# Patient Record
Sex: Female | Born: 1945 | Race: Black or African American | Hispanic: No | Smoking: Former smoker
Health system: Southern US, Community
[De-identification: ages and names within clinical notes are randomized; demographics above are authoritative.]

## PROBLEM LIST (undated history)

## (undated) DIAGNOSIS — E785 Hyperlipidemia, unspecified: Secondary | ICD-10-CM

## (undated) DIAGNOSIS — I2510 Atherosclerotic heart disease of native coronary artery without angina pectoris: Secondary | ICD-10-CM

## (undated) DIAGNOSIS — K219 Gastro-esophageal reflux disease without esophagitis: Secondary | ICD-10-CM

## (undated) DIAGNOSIS — N8110 Cystocele, unspecified: Secondary | ICD-10-CM

## (undated) DIAGNOSIS — I10 Essential (primary) hypertension: Secondary | ICD-10-CM

## (undated) DIAGNOSIS — H409 Unspecified glaucoma: Secondary | ICD-10-CM

## (undated) DIAGNOSIS — Z951 Presence of aortocoronary bypass graft: Secondary | ICD-10-CM

## (undated) DIAGNOSIS — R0602 Shortness of breath: Secondary | ICD-10-CM

## (undated) HISTORY — DX: Hyperlipidemia, unspecified: E78.5

## (undated) HISTORY — DX: Cystocele, unspecified: N81.10

## (undated) HISTORY — DX: Unspecified glaucoma: H40.9

## (undated) HISTORY — DX: Atherosclerotic heart disease of native coronary artery without angina pectoris: I25.10

## (undated) HISTORY — DX: Presence of aortocoronary bypass graft: Z95.1

## (undated) HISTORY — PX: ANTERIOR AND POSTERIOR REPAIR: SHX1172

---

## 1996-03-10 HISTORY — PX: ABDOMINAL HYSTERECTOMY: SHX81

## 1999-03-11 DIAGNOSIS — Z951 Presence of aortocoronary bypass graft: Secondary | ICD-10-CM

## 1999-03-11 HISTORY — DX: Presence of aortocoronary bypass graft: Z95.1

## 2000-01-13 ENCOUNTER — Encounter: Payer: Self-pay | Admitting: Cardiovascular Disease

## 2000-01-13 ENCOUNTER — Inpatient Hospital Stay (HOSPITAL_COMMUNITY): Admission: AD | Admit: 2000-01-13 | Discharge: 2000-01-19 | Payer: Self-pay | Admitting: Cardiovascular Disease

## 2000-01-13 HISTORY — PX: CARDIAC CATHETERIZATION: SHX172

## 2000-01-14 ENCOUNTER — Encounter: Payer: Self-pay | Admitting: Thoracic Surgery (Cardiothoracic Vascular Surgery)

## 2000-01-14 HISTORY — PX: CORONARY ARTERY BYPASS GRAFT: SHX141

## 2000-01-15 ENCOUNTER — Encounter: Payer: Self-pay | Admitting: Thoracic Surgery (Cardiothoracic Vascular Surgery)

## 2000-01-16 ENCOUNTER — Encounter: Payer: Self-pay | Admitting: Thoracic Surgery (Cardiothoracic Vascular Surgery)

## 2000-12-01 ENCOUNTER — Other Ambulatory Visit: Admission: RE | Admit: 2000-12-01 | Discharge: 2000-12-01 | Payer: Self-pay | Admitting: Obstetrics and Gynecology

## 2001-07-21 ENCOUNTER — Encounter: Payer: Self-pay | Admitting: Obstetrics and Gynecology

## 2001-07-21 ENCOUNTER — Ambulatory Visit (HOSPITAL_COMMUNITY): Admission: RE | Admit: 2001-07-21 | Discharge: 2001-07-21 | Payer: Self-pay | Admitting: Obstetrics and Gynecology

## 2002-07-25 ENCOUNTER — Ambulatory Visit (HOSPITAL_COMMUNITY): Admission: RE | Admit: 2002-07-25 | Discharge: 2002-07-25 | Payer: Self-pay | Admitting: Obstetrics and Gynecology

## 2002-07-25 ENCOUNTER — Encounter: Payer: Self-pay | Admitting: Obstetrics and Gynecology

## 2003-07-28 ENCOUNTER — Ambulatory Visit (HOSPITAL_COMMUNITY): Admission: RE | Admit: 2003-07-28 | Discharge: 2003-07-28 | Payer: Self-pay | Admitting: Obstetrics and Gynecology

## 2004-07-30 ENCOUNTER — Ambulatory Visit (HOSPITAL_COMMUNITY): Admission: RE | Admit: 2004-07-30 | Discharge: 2004-07-30 | Payer: Self-pay | Admitting: Obstetrics & Gynecology

## 2004-10-14 ENCOUNTER — Ambulatory Visit: Payer: Self-pay | Admitting: Orthopedic Surgery

## 2004-11-25 ENCOUNTER — Ambulatory Visit: Payer: Self-pay | Admitting: Orthopedic Surgery

## 2005-07-18 ENCOUNTER — Ambulatory Visit (HOSPITAL_COMMUNITY): Admission: RE | Admit: 2005-07-18 | Discharge: 2005-07-18 | Payer: Self-pay | Admitting: Internal Medicine

## 2005-07-22 ENCOUNTER — Ambulatory Visit (HOSPITAL_COMMUNITY): Admission: RE | Admit: 2005-07-22 | Discharge: 2005-07-22 | Payer: Self-pay | Admitting: Internal Medicine

## 2005-07-22 ENCOUNTER — Ambulatory Visit: Payer: Self-pay | Admitting: Internal Medicine

## 2005-07-22 HISTORY — PX: COLONOSCOPY: SHX174

## 2005-08-01 ENCOUNTER — Ambulatory Visit (HOSPITAL_COMMUNITY): Admission: RE | Admit: 2005-08-01 | Discharge: 2005-08-01 | Payer: Self-pay | Admitting: Obstetrics and Gynecology

## 2006-08-04 ENCOUNTER — Ambulatory Visit (HOSPITAL_COMMUNITY): Admission: RE | Admit: 2006-08-04 | Discharge: 2006-08-04 | Payer: Self-pay | Admitting: Obstetrics & Gynecology

## 2007-01-18 ENCOUNTER — Other Ambulatory Visit: Admission: RE | Admit: 2007-01-18 | Discharge: 2007-01-18 | Payer: Self-pay | Admitting: Obstetrics and Gynecology

## 2007-03-11 HISTORY — PX: FLEXIBLE SIGMOIDOSCOPY: SHX1649

## 2007-08-06 ENCOUNTER — Ambulatory Visit (HOSPITAL_COMMUNITY): Admission: RE | Admit: 2007-08-06 | Discharge: 2007-08-06 | Payer: Self-pay | Admitting: Obstetrics & Gynecology

## 2007-12-06 ENCOUNTER — Inpatient Hospital Stay (HOSPITAL_COMMUNITY): Admission: EM | Admit: 2007-12-06 | Discharge: 2007-12-08 | Payer: Self-pay | Admitting: Emergency Medicine

## 2007-12-07 ENCOUNTER — Encounter (INDEPENDENT_AMBULATORY_CARE_PROVIDER_SITE_OTHER): Payer: Self-pay | Admitting: Gastroenterology

## 2008-08-09 ENCOUNTER — Ambulatory Visit (HOSPITAL_COMMUNITY): Admission: RE | Admit: 2008-08-09 | Discharge: 2008-08-09 | Payer: Self-pay | Admitting: Obstetrics & Gynecology

## 2009-08-17 ENCOUNTER — Ambulatory Visit (HOSPITAL_COMMUNITY): Admission: RE | Admit: 2009-08-17 | Discharge: 2009-08-17 | Payer: Self-pay | Admitting: Obstetrics & Gynecology

## 2009-11-26 ENCOUNTER — Ambulatory Visit (HOSPITAL_COMMUNITY): Admission: RE | Admit: 2009-11-26 | Discharge: 2009-11-26 | Payer: Self-pay | Admitting: Internal Medicine

## 2010-03-31 ENCOUNTER — Encounter: Payer: Self-pay | Admitting: Obstetrics & Gynecology

## 2010-07-16 ENCOUNTER — Encounter: Payer: Self-pay | Admitting: Internal Medicine

## 2010-07-23 NOTE — Op Note (Signed)
NAMEDONNICE, NIELSEN                   ACCOUNT NO.:  [id number]   MEDICAL RECORD NO.:  [id number]          PATIENT TYPE:  INP   LOCATION:  5522                         FACILITY:  [facility]   PHYSICIAN:  Petra Kuba, M.D.    DATE OF BIRTH:  July 01, 1945   DATE OF PROCEDURE:  12/07/2007  DATE OF DISCHARGE:                               OPERATIVE REPORT   PROCEDURE:  Flex sigmoidoscopy and biopsy.   INDICATION:  Questionable ischemic colitis.  Consent was signed after  the risks, benefits, and management options thoroughly discussed  multiple times in the past and before any sedation.   MEDICINES USED:  Fentanyl 50 mcg and Versed 5 mg.   PROCEDURE:  Rectal inspection pertinent for external hemorrhoid, small.  Digital rectal exam was negative.  Pediatric colonoscope was inserted,  easily advanced to about 60 cm, beginning at about 45 cm with some  obvious ulceration and erythema, which seemed to increase proximally.  At that point, at 60 cm, there was marked narrowing and the patient  began experiencing pain and we elected to withdraw.  Few biopsies of the  ulcerated area were obtained.  I think this had more ulceration and  pseudomembranes.  Although, it could have been pseudomembranes, but the  biopsies were obtained.  Again on beginning at about the sigmoid, it was  normal, which included a good look at the rectum on straight and  retroflexed visualization, which other than some small hemorrhoids, no  other abnormalities were seen.  Anorectal pull-through confirmed the  hemorrhoids.  The scope was drained and readvanced towards the left side  of the colon.  Air was suctioned and the scope removed.  The patient  tolerated the procedure well.  There was no obvious immediate  complications.   ENDOSCOPIC DIAGNOSES:  1. Small internal and external hemorrhoids.  2. Descending ulcerations and inflammation, which increase      approximately, status post biopsy, only advanced to 60 cm, stopped     secondary to increasing inflammation and lumen narrowing and pain.  3. Normal sigmoid.   PLAN:  Await pathology.  We will have soft solids until pain is better.  Continue with antibiotics probably for 1 week and probably home to  resume a low residue diet, if no further problems and tolerating p.o.           ______________________________  Petra Kuba, M.D.     MEM/MEDQ  D:  12/07/2007  T:  12/08/2007  Job:  [id number]   cc:   R. Roetta Sessions, M.D.

## 2010-07-23 NOTE — H&P (Signed)
NAMEDEAN, Kristin Taylor                   ACCOUNT NO.:  [id number]   MEDICAL RECORD NO.:  [id number]          PATIENT TYPE:  INP   LOCATION:  5504                         FACILITY:  [facility]   PHYSICIAN:  Kristin Ferrara, MD         DATE OF BIRTH:  1946-02-23   DATE OF ADMISSION:  12/05/2007  DATE OF DISCHARGE:                              HISTORY & PHYSICAL   PRIMARY CARE PHYSICIAN:  Unassigned to [place].   HISTORY OF PRESENT ILLNESS:  The patient is a 65 year old African  American female with a chief complaint of rectal bleeding which started  earlier in the daytime accompanied by sharp, diffuse abdominal cramping,  nonradiating, accompanied by chills but no documented fevers and  diarrhea.  She gets regular colonoscopy by Dr. Christell Constant at [facility] for the purpose of the family history of colon cancer.  She denies any numbness, nausea, vomiting, hematemesis.  There is no  documented fever.  No chest pain, shortness of breath, orthopnea.   PAST MEDICAL HISTORY:  1. Coronary artery disease.  2. Hypertension.  3. Gastroesophageal reflux disease.  4. Hypercholesterolemia.   PAST SURGICAL HISTORY:  1. Status post CABG.  2. Status post hysterectomy.   ALLERGIES:  NO KNOWN DRUG ALLERGIES.   SOCIAL HISTORY:  Denies drugs, alcohol or tobacco.   MEDICATIONS AT HOME:  Lipitor and a multivitamin, Nexium, Toprol XL,  Zetia,   REVIEW OF SYSTEMS:  As per HPI, otherwise negative.   PHYSICAL EXAMINATION:  GENERAL:  The patient is in no acute distress.  VITAL SIGNS:  Blood pressure is 121/64, pulse 79, respirations 20,  temperature 97.1, pulse ox is 98% on room air.  HEENT: Normocephalic, atraumatic.  Sclerae is anicteric.  NECK:  Supple.  No JVD or carotid bruits.  PERRLA.  Extraocular muscles  intact.  CARDIOVASCULAR:  S1 and S2.  Regular rate and rhythm.  No murmurs, rubs  or clicks.  LUNGS:  Clear to auscultation bilaterally.  No rhonchi, rales or wheeze.  ABDOMEN:  Soft, nontender,  nondistended.  Positive bowel sounds tremors.  EXTREMITIES:  No clubbing, cyanosis or edema.  NEURO:  Patient is alert, oriented x3.  Cranial nerves II-XII grossly  intact.Marland Kitchen  RECTAL:  There is maroon-colored stool.  There are hemorrhoids noted  that are nonbleeding.   The patient was given pain medication and resuscitated with 1 L normal  saline in the emergency room.  She was  typed and crossed.   LABORATORY DATA:  Lactic acid 0.9.  Urinalysis shows moderate amount  leukocytes and bacteria.  Lipase is 20.  Complete metabolic panel is  within normal limits.  ACL, ALT and alk phos 20, 16, 80 respectively.  INR 1.  White count 13.1, hemoglobin 13.2, hematocrit 39.5, MCV is 89.7,  platelets 273,000 and the patient had a CT scan of the abdomen and  pelvis with contrast.  Results are pending.   ASSESSMENT/PLAN:  A 65 year old with:  1. Bright red blood per rectum with normal hemoglobin, hemorrhoids are      not bleeding.  2. History  of coronary artery disease, status post coronary artery      bypass grafting, with no chest pain, shortness of breath or EKG      changes.  3. Abdominal pain, cramping and diarrhea.  Question symptoms      consistent with diverticulitis.  Less likely ischemic colitis,      given low lactic acid.   DISCUSSION AND PLAN:  Will go ahead and admit the patient to the medical  telemetry unit.  Will type and cross and hold two units of packed red  blood cells.  Will monitor H&H q.6 h and transfuse if hemoglobin drops.  I have already spoken to [facility] for evaluation and  possible colonoscopy once the patient has cooled off although she has  mild leukocytosis.  Will start the patient on ciprofloxacin and Flagyl.  As far as the rest of her medications, will continue her medications  including her Lipitor, Nexium and Toprol-XL.  Watch her vital signs.  The rest of the plans depend on her progress and consultant  recommendations.  Will hold her aspirin  for now.      Kristin Ferrara, MD  Electronically Signed     RR/MEDQ  D:  12/06/2007  T:  12/06/2007  Job:  [id number]

## 2010-07-23 NOTE — Discharge Summary (Signed)
NAMEALEXIYA, FRANQUI                   ACCOUNT NO.:  [id number]   MEDICAL RECORD NO.:  [id number]          PATIENT TYPE:  INP   LOCATION:  5522                         FACILITY:  [facility]   PHYSICIAN:  Lonia Blood, M.D.       DATE OF BIRTH:  January 24, 1946   DATE OF ADMISSION:  12/05/2007  DATE OF DISCHARGE:  12/08/2007                               DISCHARGE SUMMARY   PRIMARY CARE PHYSICIAN:  Madelin Rear. Fusco, MD   DISCHARGE DIAGNOSES:  1. Mild ischemic colitis.  2. Atherosclerosis.  3. Coronary artery disease status post coronary artery bypass graft.  4. Mild anemia.  5. Hyperlipidemia.   DISCHARGE MEDICATIONS:  1. Flagyl 500 mg by mouth 3 times a day for 1 week.  2. Metoprolol 25 mg daily.  3. Nexium 40 mg daily.  4. Aspirin 81 mg daily.  5. Lipitor 10 mg daily.  6. Zetia 10 mg daily.  7. Calcium 600 mg daily.  8. Vitamin D 1 tablet daily.  9. Fish oil 1000 mg daily.  10.Coenzyme Q 1 daily.   CONDITION AT DISCHARGE:  Kristin Taylor is discharged in good condition.  She  has no further abdominal pain or diarrhea.   She is told to follow up with Dr. Sherwood Gambler next week.  The main thing to  discuss at the follow up visit is the patient should boost her  atherosclerosis treatment by adding Plavix.   HISTORY AND PHYSICAL:  Please refer to dictated H and P done by Dr.  Flonnie Overman on December 06, 2007.   CONSULTATION:  During this admission, this patient was seen in  consultation by Dr. Ewing Schlein from [facility].   PROCEDURE:  During this admission, the patient underwent a flex  sigmoidoscopy by Dr. Ewing Schlein, findings of colitis, possible ischemic.  Pathology pending.   HOSPITAL COURSE:  [name], a 65 year old woman, was admitted with  abdominal pain and bloody bowel movements.  She had a CT scan of her  abdomen and pelvis which indicated segmental colitis.  The patient had 3  sets of C. diff toxins which were all negative.  She remained afebrile  and without signs of sepsis.  The  patient had a flexible endoscopy which  showed mucosal changes compatible with ischemic colitis.  Given the  patient's history of atherosclerosis, we felt that this was the etiology  of her ischemic colitis.  [name] was advanced on her diet, and  she did improve nicely with resolution of her leukocytosis, abdominal  pain, and diarrhea.  The patient is discharged home on her regular home  medications and to have a follow up discussion with Dr. Sherwood Gambler and her  primary gastroenterologist about using Plavix or not.      Lonia Blood, M.D.  Electronically Signed     SL/MEDQ  D:  12/08/2007  T:  12/09/2007  Job:  [id number]   cc:   Madelin Rear. Sherwood Gambler, MD

## 2010-07-23 NOTE — Consult Note (Signed)
Kristin Taylor, Kristin Taylor                   ACCOUNT NO.:  [id number]   MEDICAL RECORD NO.:  [id number]          PATIENT TYPE:  INP   LOCATION:  5522                         FACILITY:  [facility]   PHYSICIAN:  Petra Kuba, M.D.    DATE OF BIRTH:  [birth year]   DATE OF CONSULTATION:  12/06/2007  DATE OF DISCHARGE:                                 CONSULTATION   We were asked to see Ms. Kristin Taylor today in consultation for rectal bleed by  Dr. Flonnie Overman of the [facility] Service today, December 06, 2007.   HISTORY OF PRESENT ILLNESS:  This is a very pleasant 65 year old female  who had 3 episodes of diarrhea yesterday and then some bright red blood  per rectum with a lot of abdominal cramping.  She came to the ER.  She  had 3 more episodes of diarrhea overnight.  Her abdominal pain has  resolved.  She has had no vomiting, heartburn, weight loss, or other  recent illness.  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 diff or other pathogens.  No recent antibiotic use.   PAST MEDICAL HISTORY:  Significant for her colonoscopy that was done in  May 2007, by Dr. Jena Gauss.  She had 2 cecal diverticula.  She is at high  risk for colon cancer as her mother passed with it.  She has a history  of coronary artery disease with status post coronary artery bypass  grafting x2 in November 2001.  She has a history of hypertension, GERD,  and hypercholesterolemia.  She is status post hysterectomy.  Her  medications include an 81 mg aspirin, Nexium, metoprolol, and the rest  are per chart.  She has no known drug allergies.   REVIEW OF SYSTEMS:  As per HPI, but she has no shortness of breath, no  palpitations, no anorexia.   SOCIAL HISTORY:  She has a 50-year tobacco history.  She quit smoking in  2001.  She has no alcohol history.  No drug history.   FAMILY HISTORY:  Significant for mother with colon cancer.   PHYSICAL EXAMINATION:  GENERAL:  She is alert and oriented, in no  apparent  distress.  She is very pleasant to speak with.  VITAL SIGNS:  Her temperature is 98.4, pulse 69, respirations 16, and  blood pressure is 116/65.  HEART:  Regular rate and rhythm.  LUNGS:  Clear.  ABDOMEN:  Soft, nontender, and nondistended with good bowel sounds.   LABORATORY DATA:  Hemoglobin that was 13.2 with an MCV of 89.7 upon  admission, it has dropped to 11.7 overnight with hydration.  Her  hematocrit is currently 34.4, white count 13.1.  BMET is within normal  limits except for glucose of 149.  Her lipase is 20.  LFTs are within  normal limits.  Her PT is 13.3 and INR is 1.0.  She is guaiac-positive.  Her urinalysis showed calcium oxalate crystals and a possible UTI.  On  CT scan done yesterday showed:  1. Coronary artery disease with mild abdominal aortic atherosclerosis.  2. Transverse and descending colonic thickening up to 1 cm.   ASSESSMENT:  Dr. Vida Rigger has seen and examined the patient, collected  her history, and reviewed her chart.  His impression is this is a 36-  year-old female with multiple medical problems, likely ischemic colitis.  However, this could be infectious given her exposures.   PLAN:  Supportive care with possible colonoscopy as an inpatient or an  outpatient to follow up on the bleeding after she improves.  Thanks very  much for this consultation.      Stephani Police, PA    ______________________________  Petra Kuba, M.D.    MLY/MEDQ  D:  12/06/2007  T:  12/06/2007  Job:  [id number]   cc:   R. Roetta Sessions, M.D.  Petra Kuba, M.D.  Madelin Rear. Sherwood Gambler, MD

## 2010-07-26 NOTE — Op Note (Signed)
NAME:  Kristin Taylor, Kristin Taylor                   ACCOUNT NO.:  [id number]   MEDICAL RECORD NO.:  [id number]          PATIENT TYPE:  AMB   LOCATION:  DAY                           FACILITY:  [facility]   PHYSICIAN:  R. Roetta Sessions, M.D. DATE OF BIRTH:  [birth year]   DATE OF PROCEDURE:  07/22/2005  DATE OF DISCHARGE:  07/18/2005                                 OPERATIVE REPORT   INDICATIONS FOR PROCEDURE:  Patient is a 65 year old lady with a positive  family history of colorectal cancer in her mother who comes for high risk  screening.  Her last colonoscopy was in 2000.  She had a cecal diverticulum  at that time.  She has no lower GI tract symptoms.  Colonoscopy is now being  done as a high risk screening maneuver.  This approach has been discussed  with the patient at length.  Potential risks, benefits and alternatives have  been reviewed, questions answered here, please documentation in medical  record.   PROCEDURE NOTE:  The O2 saturation, blood pressure, pulse and respirations  were monitored throughout the entire procedure.   CONSCIOUS SEDATION:  1.  Versed 4 mg IV.  2.  Demerol 75 mg IV in divided doses.   INSTRUMENT:  Olympus video chip system.   FINDINGS:  Digital rectal exam revealed no abnormalities.  No scalp  findings.  Prep was adequate.   RECTUM:  Examination of rectal mucosa including retroflexed view of the anal  verge revealed no abnormalities.   COLON:  Colonic mucosa was surveyed from the rectosigmoid junction into the  left transverse, right colon, appendiceal orifice, ileocecal valve and  cecum.  The structures were well seen and photographed for the record.  From  this level the scope was slowly and cautiously withdrawn and all previously  mentioned mucosal surfaces were again seen.  The only abnormality observed  were a couple of cecal diverticula.  Remainder of colonic mucosa appeared  normal.  The patient tolerated the procedure well, was reactive to  endoscopy.   IMPRESSION:  Two cecal diverticula.  Remainder of the colonic mucosa  appeared normal, normal rectum.   RECOMMENDATIONS:  Repeat high risk screening colonoscopy in 5 years.      Jonathon Bellows, M.D.  Electronically Signed     RMR/MEDQ  D:  07/22/2005  T:  07/22/2005  Job:  [id number]   cc:   Madelin Rear. Sherwood Gambler, MD  Fax: [phone]

## 2010-07-26 NOTE — Discharge Summary (Signed)
[place]  Patient:    Kristin Taylor, Kristin Taylor                            MRN: [id number] Adm. Date:  [id number] Disc. Date: [id number] Attending:  Charlett Lango Dictator:   Loura Pardon, P.A. CC:         Lennette Bihari, M.D.  Elfredia Nevins, M.D., [place], [place]   Discharge Summary  DATE OF BIRTH:  06/28/1945  CARDIOLOGIST:  Lennette Bihari, M.D.  PRIMARY CARE GIVER:  Elfredia Nevins, M.D.  FINAL DIAGNOSES: 1. Unstable angina. 2. Severe single-vessel atherosclerotic coronary artery disease involving the    left main coronary artery. 3. Postoperative acute blood loss anemia (no transfusion).  The nadir    hemoglobin was 7.6 on postoperative day #3.  The hemoglobin on    postoperative day #4 was 7.8.  The patient was asymptomatic.  PROCEDURES: 1. On January 13, 2000, left heart catheterization, left ventriculogram, and    coronary angiography.  The study showed a 90% ostial left main stenosis    with nonobstructive atherosclerotic coronary artery disease effecting the    large vessels of the heart.  Ejection fraction estimated at 61%.  The    cardiologist was Lennette Bihari, M.D. 2. Carotid duplex ultrasound on January 13, 2000.  This study demonstrated no    hemodynamically significant internal carotid artery stenosis. 3. On January 14, 2000, coronary artery bypass graft surgery x 2.  The grafts    were the left internal mammary artery connected in an end-to-side fashion    to the left anterior descending coronary artery and the right internal    mammary artery connected to the ramus intermedius through the transverse    sinus.  The surgeon was Salvatore Decent. Dorris Fetch, M.D.  The patient tolerated    the procedure well and was transferred in stable and satisfactory condition    to the intensive care unit.  DISCHARGE DISPOSITION:  The patient was judged a suitable candidate for discharge on postoperative day #5.  She was extubated on the evening  of surgery, achieving 100% oxygen saturation on 4 L of nasal cannula.  She was relieved of all supplemental oxygen by postoperative day #1, achieving 98% oxygen saturation.  She was 16 pounds fluid positive after her surgery and she is still 10 pounds fluid positive on postoperative day #4.  She was go home with Lasix gentle diuresis for a five-day period.  She experienced acute blood loss anemia with a hemoglobin nadir of 7.6 on postoperative day #3, rising to 7.8, and considered stable on postoperative day #4.  She will go home on iron supplemental therapy.  She received no transfusions postoperatively.  Her mental status has been clear.  She is afebrile in the postoperative period.  She has experienced no cardiac dysrhythmias and no respiratory distress.  She is ambulating independently.  She is taking oral nourishment well.  Her pain is controlled well with oral analgesia.  Her median sternotomy incision is healing well.  DISCHARGE MEDICATIONS: 1. Enteric-coated aspirin 325 mg daily. 2. Lopressor 50 mg one half tablet in the morning and one half tablet in the    evening. 3. Lasix 40 mg daily for five days. 4. Potassium chloride 20 mEq daily for five days. 5. Niferex 150 mg p.o. b.i.d. with food. 6. Percocet 5/325 mg one to two tablets every four to six hours p.r.n. pain. 7. Resuming estropipate tablet  taken daily.  DISCHARGE ACTIVITY:  Ambulation as tolerated.  She is asked not to drive nor lift more than 10 pounds for six weeks.  DISCHARGE DIET:  Low sodium, low cholesterol.  WOUND CARE:  She may shower daily, keeping the incision clean and dry.  FOLLOW-UP:  She will have a follow-up visit with her cardiologist, Lennette Bihari, M.D., in two weeks.  She is asked to call [phone] to arrange that. She will have an appointment with Salvatore Decent. Dorris Fetch, M.D., in three weeks after discharge.  Dr. Rondel Jumbo office will call to arrange this.  BRIEF HISTORY:  [name] is a  65 year old female without known prior history of atherosclerotic coronary artery disease.  For the past several months, she has noticed indigestion and heartburn with exertion.  She experiences this while walking fast, especially up hill.  Sometimes the indigestion symptoms radiate down her back and down her left arm.  She did undergo an exercise Cardiolite study earlier the past summer.  She had reduced aerobic capacity and exercises for 4 minutes and 46 seconds under standard Bruce protocol.  The peak heart rate was 160 at 100% of maximum.  She developed transient trigeminy in stage 2 of the protocol and she developed upsloping ST segment depression at peak stress with fairly prompt normalization two minutes after recovery. Scintigraphic changes were notable in that there was very small zone and subtle apical/distal inferolateral ischemia.  The patient was followed up by Elfredia Nevins, M.D.  She presented to his office on January 10, 2000, with complaints of exertion and precipitated indigestion and is here now for cardiology consultation.  HOSPITAL COURSE:  She was admitted to [facility] to the service of Lennette Bihari, M.D., on January 13, 2000.  She underwent left heart catheterization the same day.  The study demonstrated a 90% ostial left main stenosis with an ejection fraction estimated at 61%.  She had no particularly obstructive coronary disease otherwise.  She was scheduled for coronary artery revascularization.  Salvatore Decent Dorris Fetch, M.D., of the [facility] was consulted.  He described the risks and benefits of cardiac surgery to Ms. Salem and she elected to undergo the procedure.  She also had a carotid duplex ultrasound which showed no hemodynamically significant internal carotid artery stenosis.  The surgery was performed on January 14, 2000.  Two bypasses were placed as described above.  She was extubated on the  day of surgery.  Her cardiac index in the  immediate postoperative period was 2.22.  Cardiac output was 4.35.  The postoperative hemoglobin was 9, hematocrit 25.4%, and platelets 203,000. After extubation, she was achieving 100% oxygen saturation on 4 L of nasal cannula.  On postoperative day #1, the hemoglobin was 9, hematocrit 27, she was achieving 98% oxygen saturation on room air, and she remained on room air throughout the entire postoperative period.  She was in a sinus rhythm and 16 pound fluid overload.  Gentle diuresis was begun on postoperative day #1. On postoperative day #2, the incisions were healing nicely.  The pleural tubes were discontinued.  The hemoglobin had fallen to 8.2.  The creatinine was 0.7. On postoperative day #3, iron supplementation was added for a hemoglobin of 7.6 and her hematocrit was watched carefully.  She would get transfusion if the hematocrit dropped to below 21%.  On postoperative day #4, she was ambulating independently.  Her mental status was clear.  The hematocrit had risen to 22.5%.  She  was still 10 pounds fluid positive and diuresis would continue as an outpatient.  She was judged a suitable candidate for discharge on postoperative day #5, January 19, 2000.  She goes home with medications and discharge follow-up instructions as dictated above. DD:  01/18/00 TD:  01/19/00 Job: [id number]

## 2010-07-26 NOTE — Op Note (Signed)
Kristin Taylor. [facility]  Patient:    Kristin Taylor, Kristin Taylor                            MRN: [id number] Proc. Date: 01/14/00 Adm. Date:  [id number] Attending:  Charlett Lango CC:         Lennette Bihari, M.D.  Dr. Artis Delay   Operative Report  PREOPERATIVE DIAGNOSIS:  Left main disease, unstable angina.  POSTOPERATIVE DIAGNOSIS:  Left main disease, unstable angina.  OPERATION PERFORMED:  Median sternotomy, extracorporeal circulation, coronary artery bypass grafting x 2, left internal mammary artery to left anterior descending, right internal mammary artery to ramus intermedius (via transverse sinus).  SURGEON:  Salvatore Decent. Dorris Fetch, M.D.  ASSISTANT:  Sherrie George, P.A.  ANESTHESIA:  General.  FINDINGS:  Good quality targets, good quality conduits, preserved left ventricular function.  INDICATIONS FOR PROCEDURE:  The patient is a 65 year old female with approximately a one-year history of exertional "indigestion" and now has had progression of her symptoms to minimal activity and even has had symptoms at rest.  She underwent cardiac catheterization by Dr. Nicki Guadalajara which revealed a 90% ostial left main stenosis.  Patient was referred for coronary artery bypass grafting.  The indications, risks, benefits and alternatives were discussed in detail with the patient and she understood and accepted the risks and agreed to proceed.  She was scheduled for the first available operating time which was the following morning.  DESCRIPTION OF PROCEDURE:  [name] was brought to the preop holding area on January 14, 2000.  Lines were placed to monitor arterial, central venous and pulmonary arterial pressure.  EKG leads were placed for continuous telemetry. The patient was taken to the operating room, anesthetized and intubated.  A Foley catheter was placed.  Intravenous antibiotics were administered.  The chest, abdomen and legs were prepped and draped in the  usual fashion.  A median sternotomy was performed.  The left internal mammary artery was harvested in standard fashion.  Large branches were doubly clipped and divided.  Small branches were clipped on the mammary side and divided with cautery on the chest wall side.  The left mammary was a 2 mm good quality conduit.  The patient was given 5000 units of heparin prior to dividing the distal end of the left mammary.  There was good flow to the cut end of the mammary.  After ensuring that all side branches had been clipped, the mammary was placed in a papaverine soaked sponge and placed into the left pleural space.  Next, the right internal mammary artery was harvested in standard fashion.  This was also a good quality conduit.  The remainder of the full heparin dose was given before dividing the distal end of the right mammary artery.  There was excellent flow through the graft.  Again, after ensuring that all side branches had been clipped, it was placed in a papaverine soaked sponge and placed into the right pleural space.  The pericardium was opened.  The ascending aorta was palpated.  There was no palpable atherosclerotic disease.  The aorta was cannulated via concentric 2-0 Ethibond nonpledged pursestring sutures.  The dual stage venous cannula was placed via pursestring suture in the right atrial appendage.  Cardiopulmonary bypass was instituted and the patient was cooled to 34 degrees Celsius.  The coronary arteries were inspected and the anastomotic sites were chosen.  The conduits were inspected and cut to length.  [employer]  care was taken to ensure that the right mammary artery would reach the ramus intermedius would reach the transverse sinus without tension.  A foam pad was placed in the pericardium to protect the left phrenic nerve.  A temperature probe was placed in the myocardial septum and a cardioplegia cannula was placed in the ascending aorta.  The aorta was crossclamped.   The left ventricle was emptied via the aortic root vent.  Cardiac arrest then was achieved with a combination of cold antegrade blood cardioplegia and topical iced saline.  500 cc of cardioplegia was administered and myocardial septal temperature was 11 degrees Celsius. The following distal anastomoses were performed.  First the right internal mammary artery was brought through a window in the right side of the pericardium.  This passed through the transverse sinus taking care not to twist or kink the vessel.  The heart was elevated to expose the ramus intermedius.  The distal end of the right internal mammary artery was spatulated and arteriotomy was made in the ramus intermedius.  This was a 1.5 mm good quality target.  The right internal mammary artery was also 1.5 mm.  The anastomosis was performed end-to-side with a running 8-0 Prolene suture.  At completion of the anastomosis, the bulldog clamp was briefly removed.  There was good flush of blood from the artery.  Some septal rewarming occurred.  The bulldog clamp was placed back on the right mammary after ensuring that there was adequate hemostasis at the anastomosis.  The mammary pedicle was tacked to the epicardial surface of the heart with interrupted 6-0 Prolene sutures to relieve any tension on the anastomosis.  Additional cardioplegia was administered via the aortic root.  The left internal mammary artery then was brought through a window in the pericardium anterior to the left phrenic nerve.  The distal end of the mammary artery was spatulated and was anastomosed end-to-side to the distal LAD, which was a 2 mm good quality target.  The left internal mammary artery was a 2 mm good quality conduit.  The anastomosis was performed end-to-side with a running 8-0 Prolene suture.  At the end of the anastomosis, the bulldog clamp was removed from the mammary.  Immediate and rapid septal rewarming was noted.  Lidocaine  was  administered.  The mammary pedicle was tacked to the epicardial surface of the heart with 6-0 Prolene sutures.  The bulldog clamp was removed from the right mammary artery.  The aortic crossclamp was removed. Total crossclamp time was 38 minutes.  The mammary pedicles were inspected for hemostasis.  The lungs were given a test inflation and there appeared to be slight tension on the right mammary artery and two fascial releases were performed which relieved this and there was no tension on the right mammary.  The patient was rewarmed.  Epicardial pacing wires were placed on the right ventricle and right atrium.  When the core temperature reached 37 degrees Celsius, the patient was weaned from cardiopulmonary bypass.  No defibrillations have been required after removal of the crossclamp. The patient was in sinus rhythm and on no inotropic support at the time of separation from bypass.  She was weaned from bypass without difficulty.  Initial cardiac index was greater than 2L per minute per meter squared.  The patient remained hemodynamically stable throughout the postbypass period. Total bypass time was 79 minutes.  A test dose of protamine was administered and was well tolerated.  The atrial and aortic cannulae were removed.  There  was good hemostasis at both cannulation sites.  The cardioplegia cannula was removed.  There was also good hemostasis at this site.  The remainder of the protamine was administered without incident.  The chest was irrigated with 1L of warm normal saline containing 1 gm of vancomycin.  Hemostasis was achieved.  Bilateral pleural tubes and two mediastinal chest tubes were placed through separate subcostal incisions.  The pericardium was reapproximated over the aorta and base of the heart with interrupted 3-0 silk sutures.  The pericardium came together without tension.  The sternum was closed with heavy gauge interrupted stainless steel wires. There was no  hemodynamic deterioration with closure of the sternum.  The pectoralis fascia then was closed with running #1 Vicryl suture.  The subcutaneous tissues were closed with running 2-0 Vicryl suture and the skin was closed with 3-0 Vicryl subcuticular suture.  Sponge, needle and instrument counts were correct at the end of the procedure.  The patient remained hemodynamically stable throughout the postbypass period and was taken from the operating room to the surgical intensive care unit intubated in stable condition. DD:  01/14/00 TD:  01/15/00 Job: [id number]

## 2010-07-26 NOTE — Cardiovascular Report (Signed)
[name]. [facility]  Patient:    Kristin Taylor, Kristin Taylor                            MRN: [id number] Adm. Date:  [id number] Attending:  Charlett Lango CC:         Dr. Artis Delay, [facility], [place], [place]  Orville Govern, office   Cardiac Catheterization  DATE OF BIRTH:  February 12, 1946  INDICATIONS:  The patient is a very pleasant 65 year old black female who has developed progressive exertional indigestion symptomatology. An exercise Cardiolite study has shown upsloping ST segment depression of 3 mm at peak stress and scintigraphic images showed a small zone of apical distal inferolateral ischemia. Because of progressive symptoms she is now referred for catheterization.  RESULTS:  HEMODYNAMIC DATA:  Central aortic pressure 110/66, left ventricular pressure 110/9.  ANGIOGRAPHIC DATA: 1. There was a 90% eccentric ostial left main stenosis. Careful attention was    made to keep the catheter out of the left main and this was visualized    initially on the scout film prior to catheter engagement. Intracoronary    nitroglycerin was also administered, 200 mcg, without significant    improvement in the left main stenosis.  2. The LAD was angiographically normal and gave rise to two major diagonal    vessels.  3. The intermediate vessel was angiographically normal.  4. The circumflex vessel was angiographically normal and gave rise to two    major marginal vessels.  5. The right coronary artery was angiographically normal and gave rise to the    PDA small posterolateral system.  BIPLANE CINE LEFT VENTRICULOGRAPHY: 1. This revealed normal LV function. Ejection fraction 61%.  2. The left subclavian internal mammary arteries were injected and normal.    These were suitable for CABG surgery.  3. Distal aortography did not demonstrate any evidence for renal artery    stenosis. There was no significant aortoiliac disease, in the event the    patient  required intra-aortic balloon for counter pulsation.  IMPRESSION: 1. Normal left ventricular function (ejection fraction 615). 2. 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  RECOMMENDATION:  CABG revascularization surgery. DD:  01/13/00 TD:  01/13/00 Job: [id number]

## 2010-07-30 ENCOUNTER — Ambulatory Visit: Payer: Self-pay | Admitting: Urgent Care

## 2010-07-31 ENCOUNTER — Other Ambulatory Visit: Payer: Self-pay | Admitting: Obstetrics & Gynecology

## 2010-07-31 DIAGNOSIS — Z139 Encounter for screening, unspecified: Secondary | ICD-10-CM

## 2010-08-19 ENCOUNTER — Ambulatory Visit (HOSPITAL_COMMUNITY): Payer: 59

## 2010-08-23 ENCOUNTER — Ambulatory Visit (HOSPITAL_COMMUNITY)
Admission: RE | Admit: 2010-08-23 | Discharge: 2010-08-23 | Disposition: A | Discharge status: Home / Self Care | Payer: 59 | Source: Ambulatory Visit | Attending: Obstetrics & Gynecology | Admitting: Obstetrics & Gynecology

## 2010-08-23 DIAGNOSIS — Z139 Encounter for screening, unspecified: Secondary | ICD-10-CM

## 2010-08-23 DIAGNOSIS — Z1231 Encounter for screening mammogram for malignant neoplasm of breast: Secondary | ICD-10-CM | POA: Insufficient documentation

## 2010-12-09 HISTORY — PX: NM MYOCAR PERF WALL MOTION: HXRAD629

## 2010-12-09 LAB — URINALYSIS, ROUTINE W REFLEX MICROSCOPIC
Bilirubin Urine: NEGATIVE
Glucose, UA: NEGATIVE
Hgb urine dipstick: NEGATIVE
Nitrite: NEGATIVE
Urobilinogen, UA: 1
pH: 5.5

## 2010-12-09 LAB — DIFFERENTIAL
Basophils Relative: 0
Eosinophils Absolute: 0
Monocytes Absolute: 0.6
Neutro Abs: 11.7 — ABNORMAL HIGH

## 2010-12-09 LAB — BASIC METABOLIC PANEL
CO2: 25
CO2: 26
Calcium: 9.1
Calcium: 9.7
Creatinine, Ser: 0.56
Creatinine, Ser: 0.63
GFR calc Af Amer: >60
GFR calc Af Amer: >60
GFR calc non Af Amer: >60
GFR calc non Af Amer: >60
Glucose, Bld: 97
Sodium: 139
Sodium: 141

## 2010-12-09 LAB — HEMOGLOBIN AND HEMATOCRIT, BLOOD
Hemoglobin: 11.3 — ABNORMAL LOW
Hemoglobin: 11.7 — ABNORMAL LOW
Hemoglobin: 11.9 — ABNORMAL LOW

## 2010-12-09 LAB — COMPREHENSIVE METABOLIC PANEL
ALT: 16
AST: 20
Alkaline Phosphatase: 80
CO2: 25
Calcium: 10.4
GFR calc Af Amer: >60
Potassium: 4.4
Sodium: 137
Total Protein: 7

## 2010-12-09 LAB — CLOSTRIDIUM DIFFICILE EIA
C difficile Toxins A+B, EIA: NEGATIVE
C difficile Toxins A+B, EIA: NEGATIVE

## 2010-12-09 LAB — CBC
HCT: 39.5
Hemoglobin: 12.5
Hemoglobin: 13.2
MCHC: 33.6
MCHC: 33.7
Platelets: 218
Platelets: 273
RBC: 4.12
RDW: 13.2
RDW: 13.5

## 2010-12-09 LAB — URINE MICROSCOPIC-ADD ON

## 2010-12-09 LAB — CROSSMATCH: ABO/RH(D): B POS

## 2010-12-09 LAB — LACTIC ACID, PLASMA: Lactic Acid, Venous: 0.9

## 2010-12-10 ENCOUNTER — Other Ambulatory Visit: Payer: Self-pay | Admitting: Obstetrics and Gynecology

## 2012-02-08 HISTORY — PX: TRANSTHORACIC ECHOCARDIOGRAM: SHX275

## 2012-02-16 ENCOUNTER — Other Ambulatory Visit (HOSPITAL_COMMUNITY): Payer: Self-pay | Admitting: Cardiovascular Disease

## 2012-02-16 DIAGNOSIS — I2510 Atherosclerotic heart disease of native coronary artery without angina pectoris: Secondary | ICD-10-CM

## 2012-02-16 DIAGNOSIS — E782 Mixed hyperlipidemia: Secondary | ICD-10-CM

## 2012-02-16 DIAGNOSIS — I10 Essential (primary) hypertension: Secondary | ICD-10-CM

## 2012-02-24 ENCOUNTER — Ambulatory Visit (HOSPITAL_COMMUNITY): Payer: 59

## 2012-02-25 ENCOUNTER — Ambulatory Visit (HOSPITAL_COMMUNITY)
Admission: RE | Admit: 2012-02-25 | Discharge: 2012-02-25 | Disposition: A | Discharge status: Home / Self Care | Payer: Medicare Other | Source: Ambulatory Visit | Attending: Cardiovascular Disease | Admitting: Cardiovascular Disease

## 2012-02-25 DIAGNOSIS — E782 Mixed hyperlipidemia: Secondary | ICD-10-CM

## 2012-02-25 DIAGNOSIS — E785 Hyperlipidemia, unspecified: Secondary | ICD-10-CM | POA: Insufficient documentation

## 2012-02-25 DIAGNOSIS — I079 Rheumatic tricuspid valve disease, unspecified: Secondary | ICD-10-CM | POA: Insufficient documentation

## 2012-02-25 DIAGNOSIS — I10 Essential (primary) hypertension: Secondary | ICD-10-CM | POA: Insufficient documentation

## 2012-02-25 DIAGNOSIS — I2510 Atherosclerotic heart disease of native coronary artery without angina pectoris: Secondary | ICD-10-CM | POA: Insufficient documentation

## 2012-02-25 DIAGNOSIS — R002 Palpitations: Secondary | ICD-10-CM | POA: Insufficient documentation

## 2012-02-25 NOTE — Progress Notes (Signed)
[facility]   2D echo completed 02/25/2012.   Cindy Kalista Laguardia, RDCS   

## 2012-05-19 ENCOUNTER — Other Ambulatory Visit: Payer: Self-pay | Admitting: Obstetrics and Gynecology

## 2012-06-12 ENCOUNTER — Encounter (HOSPITAL_COMMUNITY): Payer: Self-pay | Admitting: Pharmacy Technician

## 2012-06-14 ENCOUNTER — Encounter (HOSPITAL_COMMUNITY): Payer: Self-pay

## 2012-06-14 ENCOUNTER — Encounter (HOSPITAL_COMMUNITY)
Admission: RE | Admit: 2012-06-14 | Discharge: 2012-06-14 | Disposition: A | Discharge status: Home / Self Care | Payer: Medicare Other | Source: Ambulatory Visit | Attending: Obstetrics and Gynecology | Admitting: Obstetrics and Gynecology

## 2012-06-14 HISTORY — DX: Shortness of breath: R06.02

## 2012-06-14 HISTORY — DX: Essential (primary) hypertension: I10

## 2012-06-14 HISTORY — DX: Gastro-esophageal reflux disease without esophagitis: K21.9

## 2012-06-14 LAB — CBC
Hemoglobin: 13.2 g/dL (ref 12.0–15.0)
Platelets: 281 10*3/uL (ref 150–400)
RBC: 4.45 MIL/uL (ref 3.87–5.11)
WBC: 8.1 10*3/uL (ref 4.0–10.5)

## 2012-06-14 LAB — BASIC METABOLIC PANEL
CO2: 29 mEq/L (ref 19–32)
Calcium: 10.2 mg/dL (ref 8.4–10.5)
Chloride: 104 mEq/L (ref 96–112)
Glucose, Bld: 94 mg/dL (ref 70–99)
Potassium: 4.2 mEq/L (ref 3.5–5.1)
Sodium: 139 mEq/L (ref 135–145)

## 2012-06-14 NOTE — Patient Instructions (Addendum)
Your procedure is scheduled on:06/22/12  Enter through the Main Entrance at : 6am Pick up desk phone and dial [phone] and inform us of your arrival.  Please call [phone] if you have any problems the morning of surgery.  Remember: Do not eat or drink after midnight: Monday   Take these meds the morning of surgery with a sip of water:Nexium, Metoprolol  DO NOT wear jewelry, eye make-up, lipstick,body lotion, or dark fingernail polish.   If you are to be admitted after surgery, leave suitcase in car until your room has been assigned. Patients discharged on the day of surgery will not be allowed to drive home.

## 2012-06-21 NOTE — H&P (Signed)
Kristin Taylor is an 67 y.o. female who previously had a hysterectomy but presented to my office in October 2013 complaining of something bulging from her vagina. On examination she was noted to have a large cystocele and small rectocele but the bulge in question was due to the cystocele. She elected to observe this after the etiology was explained but she returned in March 2014 reporitng the symptoms were getting worse. This was causing increasing pressure and pain. She is now being taken to the operating room for repair of this pelvic floor defect.  Pertinent Gynecological History:  Previous GYN Procedures: Hysterectomy  Last mammogram: normal Date: 08/2011 Last pap: normal Date:12/2010  OB History: G2, P2002    Past Medical History  Diagnosis Date  . GERD (gastroesophageal reflux disease)   . Hypertension   . Shortness of breath     on exertion    Past Surgical History  Procedure Laterality Date  . Coronary artery bypass graft  2001  . Cardiac catheterization  2001  . Abdominal hysterectomy  1998    No family history on file.  Social History:  reports that she quit smoking about 13 years ago. Her smoking use included Cigarettes. She smoked 0.00 packs per day. She does not have any smokeless tobacco history on file. She reports that she drinks about 0.6 ounces of alcohol per week. She reports that she does not use illicit drugs.  Allergies: No Known Allergies  No prescriptions prior to admission    Review of Systems  Constitutional: Negative for fever, chills and weight loss.  HENT: Negative for tinnitus.   Respiratory: Negative for cough, hemoptysis and wheezing.   Cardiovascular: Negative for chest pain, palpitations and orthopnea.  Gastrointestinal: Negative for heartburn, nausea, vomiting, abdominal pain, diarrhea, constipation and blood in stool.  Genitourinary: Negative for dysuria, urgency, frequency and hematuria.  Neurological: Negative for headaches.    There were  no vitals taken for this visit. Physical Exam  Constitutional: She is oriented to person, place, and time. She appears well-developed and well-nourished.  HENT:  Head: Normocephalic and atraumatic.  Eyes: Conjunctivae and EOM are normal. Pupils are equal, round, and reactive to light.  Neck: Normal range of motion. Neck supple. No tracheal deviation present. No thyromegaly present.  Cardiovascular: Normal rate, regular rhythm and normal heart sounds.  Exam reveals no gallop.   No murmur heard. Respiratory: Effort normal and breath sounds normal.  GI: She exhibits no distension. There is no tenderness. There is no rebound and no guarding.  Musculoskeletal: She exhibits no edema.  Neurological: She is alert and oriented to person, place, and time.  Skin: Skin is warm and dry.  Psychiatric: She has a normal mood and affect. Her behavior is normal. Judgment and thought content normal.  Gyn Exam: External genitalia: Within normal limits          BUS: within normal limits          Vaginia: has 2nd to 3rd degree cystocele and 1st degree rectocele no enterocele                      Uterus is absent          Adnexa: no masses are noted.    No results found for this or any previous visit (from the past 24 hour(s)).  No results found.  Impression: Symptomatic cystocele and small rectocele  Plan: Anterior and possible posterior colporrhaphy  The risks and benefits of the procedure  have been discussed and informed consent has been obtained   Nikolaus Pienta 06/21/2012, 1:59 PM

## 2012-06-22 ENCOUNTER — Encounter (HOSPITAL_COMMUNITY): Payer: Self-pay | Admitting: Anesthesiology

## 2012-06-22 ENCOUNTER — Encounter (HOSPITAL_COMMUNITY)
Admission: RE | Disposition: A | Discharge status: Home / Self Care | Payer: Self-pay | Source: Ambulatory Visit | Attending: Obstetrics and Gynecology

## 2012-06-22 ENCOUNTER — Ambulatory Visit (HOSPITAL_COMMUNITY): Payer: Medicare Other | Admitting: Anesthesiology

## 2012-06-22 ENCOUNTER — Observation Stay (HOSPITAL_COMMUNITY)
Admission: RE | Admit: 2012-06-22 | Discharge: 2012-06-23 | Disposition: A | Discharge status: Home / Self Care | Payer: Medicare Other | Source: Ambulatory Visit | Attending: Obstetrics and Gynecology | Admitting: Obstetrics and Gynecology

## 2012-06-22 DIAGNOSIS — N993 Prolapse of vaginal vault after hysterectomy: Principal | ICD-10-CM | POA: Insufficient documentation

## 2012-06-22 DIAGNOSIS — I10 Essential (primary) hypertension: Secondary | ICD-10-CM | POA: Insufficient documentation

## 2012-06-22 DIAGNOSIS — K219 Gastro-esophageal reflux disease without esophagitis: Secondary | ICD-10-CM | POA: Insufficient documentation

## 2012-06-22 HISTORY — PX: CYSTOCELE REPAIR: SHX163

## 2012-06-22 SURGERY — COLPORRHAPHY, ANTERIOR, FOR CYSTOCELE REPAIR
Anesthesia: General | Site: Vagina | Wound class: Clean Contaminated

## 2012-06-22 MED ORDER — ONDANSETRON HCL 4 MG/2ML IJ SOLN
INTRAMUSCULAR | Status: AC
  Filled 2012-06-22: qty 2

## 2012-06-22 MED ORDER — LIDOCAINE HCL (CARDIAC) 20 MG/ML IV SOLN
INTRAVENOUS | Status: DC | PRN
  Administered 2012-06-22: 40 mg via INTRAVENOUS

## 2012-06-22 MED ORDER — LACTATED RINGERS IV SOLN
INTRAVENOUS | Status: DC
  Administered 2012-06-22 – 2012-06-23 (×2): via INTRAVENOUS

## 2012-06-22 MED ORDER — OXYCODONE-ACETAMINOPHEN 5-325 MG PO TABS: 1.0000 | ORAL_TABLET | ORAL | Status: DC | PRN

## 2012-06-22 MED ORDER — KETOROLAC TROMETHAMINE 30 MG/ML IJ SOLN
INTRAMUSCULAR | Status: AC
  Filled 2012-06-22: qty 1

## 2012-06-22 MED ORDER — PROMETHAZINE HCL 25 MG/ML IJ SOLN: 6.2500 mg | INTRAMUSCULAR | Status: DC | PRN

## 2012-06-22 MED ORDER — MIDAZOLAM HCL 5 MG/5ML IJ SOLN
INTRAMUSCULAR | Status: DC | PRN
  Administered 2012-06-22: 2 mg via INTRAVENOUS

## 2012-06-22 MED ORDER — FENTANYL CITRATE 0.05 MG/ML IJ SOLN
INTRAMUSCULAR | Status: DC | PRN
  Administered 2012-06-22 (×2): 50 ug via INTRAVENOUS

## 2012-06-22 MED ORDER — LIDOCAINE-EPINEPHRINE 1 %-1:100000 IJ SOLN
INTRAMUSCULAR | Status: DC | PRN
  Administered 2012-06-22: 4 mL

## 2012-06-22 MED ORDER — PROPOFOL 10 MG/ML IV EMUL
INTRAVENOUS | Status: AC
  Filled 2012-06-22: qty 20

## 2012-06-22 MED ORDER — KETOROLAC TROMETHAMINE 30 MG/ML IJ SOLN: 15.0000 mg | Freq: Once | INTRAMUSCULAR | Status: DC | PRN

## 2012-06-22 MED ORDER — MIDAZOLAM HCL 2 MG/2ML IJ SOLN: 0.5000 mg | Freq: Once | INTRAMUSCULAR | Status: DC | PRN

## 2012-06-22 MED ORDER — FENTANYL CITRATE 0.05 MG/ML IJ SOLN
INTRAMUSCULAR | Status: AC
  Filled 2012-06-22: qty 2

## 2012-06-22 MED ORDER — MENTHOL 3 MG MT LOZG: 1.0000 | LOZENGE | OROMUCOSAL | Status: DC | PRN

## 2012-06-22 MED ORDER — DEXAMETHASONE SODIUM PHOSPHATE 10 MG/ML IJ SOLN
INTRAMUSCULAR | Status: AC
  Filled 2012-06-22: qty 1

## 2012-06-22 MED ORDER — KETOROLAC TROMETHAMINE 30 MG/ML IJ SOLN
INTRAMUSCULAR | Status: DC | PRN
  Administered 2012-06-22: 15 mg via INTRAVENOUS

## 2012-06-22 MED ORDER — LACTATED RINGERS IV SOLN
INTRAVENOUS | Status: DC
  Administered 2012-06-22: 07:00:00 via INTRAVENOUS

## 2012-06-22 MED ORDER — MEPERIDINE HCL 25 MG/ML IJ SOLN: 6.2500 mg | INTRAMUSCULAR | Status: DC | PRN

## 2012-06-22 MED ORDER — LIDOCAINE HCL (CARDIAC) 20 MG/ML IV SOLN
INTRAVENOUS | Status: AC
  Filled 2012-06-22: qty 5

## 2012-06-22 MED ORDER — FENTANYL CITRATE 0.05 MG/ML IJ SOLN: 25.0000 ug | INTRAMUSCULAR | Status: DC | PRN

## 2012-06-22 MED ORDER — BUTORPHANOL TARTRATE 1 MG/ML IJ SOLN: 1.0000 mg | INTRAMUSCULAR | Status: DC | PRN

## 2012-06-22 MED ORDER — MIDAZOLAM HCL 2 MG/2ML IJ SOLN
INTRAMUSCULAR | Status: AC
  Filled 2012-06-22: qty 2

## 2012-06-22 MED ORDER — IBUPROFEN 600 MG PO TABS: 600.0000 mg | ORAL_TABLET | Freq: Four times a day (QID) | ORAL | Status: DC | PRN

## 2012-06-22 MED ORDER — ONDANSETRON HCL 4 MG/2ML IJ SOLN
INTRAMUSCULAR | Status: DC | PRN
  Administered 2012-06-22: 4 mg via INTRAVENOUS

## 2012-06-22 MED ORDER — PROPOFOL 10 MG/ML IV BOLUS
INTRAVENOUS | Status: DC | PRN
  Administered 2012-06-22: 150 mg via INTRAVENOUS

## 2012-06-22 MED ORDER — METOPROLOL SUCCINATE ER 25 MG PO TB24
75.0000 mg | ORAL_TABLET | Freq: Every day | ORAL | Status: DC
  Administered 2012-06-23: 75 mg via ORAL
  Filled 2012-06-22: qty 1

## 2012-06-22 MED ORDER — DEXAMETHASONE SODIUM PHOSPHATE 10 MG/ML IJ SOLN
INTRAMUSCULAR | Status: DC | PRN
  Administered 2012-06-22: 10 mg via INTRAVENOUS

## 2012-06-22 SURGICAL SUPPLY — 26 items
BLADE SURG 15 STRL LF C SS BP (BLADE) ×1 IMPLANT
BLADE SURG 15 STRL SS (BLADE) ×2
CANISTER SUCTION 2500CC (MISCELLANEOUS) ×2 IMPLANT
CATH BONANNO SUPRAPUBIC 14G (CATHETERS) IMPLANT
CLOTH BEACON ORANGE TIMEOUT ST (SAFETY) ×2 IMPLANT
DECANTER SPIKE VIAL GLASS SM (MISCELLANEOUS) IMPLANT
GAUZE PACKING IODOFORM 1 (PACKING) ×1 IMPLANT
GLOVE BIO SURGEON STRL SZ7.5 (GLOVE) ×2 IMPLANT
GLOVE BIOGEL PI IND STRL 6 (GLOVE) ×1 IMPLANT
GLOVE BIOGEL PI INDICATOR 6 (GLOVE) ×1
GOWN PREVENTION PLUS LG XLONG (DISPOSABLE) ×6 IMPLANT
GOWN PREVENTION PLUS XXLARGE (GOWN DISPOSABLE) ×2 IMPLANT
NEEDLE HYPO 22GX1.5 SAFETY (NEEDLE) IMPLANT
NS IRRIG 1000ML POUR BTL (IV SOLUTION) ×2 IMPLANT
PACK VAGINAL WOMENS (CUSTOM PROCEDURE TRAY) ×2 IMPLANT
SUT VIC AB 0 CT1 18XCR BRD8 (SUTURE) IMPLANT
SUT VIC AB 0 CT1 27 (SUTURE) ×4
SUT VIC AB 0 CT1 27XBRD ANBCTR (SUTURE) IMPLANT
SUT VIC AB 0 CT1 8-18 (SUTURE)
SUT VIC AB 2-0 CT1 27 (SUTURE)
SUT VIC AB 2-0 CT1 TAPERPNT 27 (SUTURE) IMPLANT
SUT VIC AB 2-0 SH 27 (SUTURE) ×8
SUT VIC AB 2-0 SH 27XBRD (SUTURE) IMPLANT
TOWEL OR 17X24 6PK STRL BLUE (TOWEL DISPOSABLE) ×4 IMPLANT
TRAY FOLEY CATH 14FR (SET/KITS/TRAYS/PACK) ×2 IMPLANT
WATER STERILE IRR 1000ML POUR (IV SOLUTION) ×2 IMPLANT

## 2012-06-22 NOTE — H&P (Signed)
  tatus unchanged will proceed with planned procedure.

## 2012-06-22 NOTE — Brief Op Note (Signed)
06/22/2012  8:06 AM  PATIENT:  Kristin Taylor  67 y.o. female  PRE-OPERATIVE DIAGNOSIS:  CYSTOCELE  POST-OPERATIVE DIAGNOSIS:  cystocele  PROCEDURE:  Procedure(s): ANTERIOR REPAIR (CYSTOCELE) (N/A)  SURGEON:  Surgeon(s) and Role:    * Miguel Aschoff, MD - Primary    * W Lodema Hong, MD - Assisting  ANESTHESIA:   general  EBL:  Total I/O In: 1200 [I.V.:1200] Out: 155 [Urine:150; Blood:5]  BLOOD ADMINISTERED:none  DRAINS: Urinary Catheter (Foley)   LOCAL MEDICATIONS USED:  LIDOCAINE   SPECIMEN:  No Specimen  DISPOSITION OF SPECIMEN:  N/A  COUNTS:  YES  TOURNIQUET:  * No tourniquets in log *  DICTATION: .Other Dictation: Dictation Number [id number]  PLAN OF CARE: Admit for overnight observation  PATIENT DISPOSITION:  PACU - hemodynamically stable.   Delay start of Pharmacological VTE agent (>24hrs) due to surgical blood loss or risk of bleeding: SCD's applied

## 2012-06-22 NOTE — Interval H&P Note (Signed)
History and Physical Interval Note:  06/22/2012 6:27 AM  Kristin Taylor  has presented today for surgery, with the diagnosis of CYSTOCELE  The various methods of treatment have been discussed with the patient and family. After consideration of risks, benefits and other options for treatment, the patient has consented to  Procedure(s): ANTERIOR REPAIR (CYSTOCELE) (N/A) as a surgical intervention .  The patient's history has been reviewed, patient examined, no change in status, stable for surgery.  I have reviewed the patient's chart and labs.  Questions were answered to the patient's satisfaction.     Burnard Enis

## 2012-06-22 NOTE — Anesthesia Postprocedure Evaluation (Signed)
  Anesthesia Post-op Note  Patient: Kristin Taylor  Procedure(s) Performed: Procedure(s): ANTERIOR REPAIR (CYSTOCELE) (N/A)  Patient Location: PACU  Anesthesia Type:General  Level of Consciousness: awake, alert  and oriented  Airway and Oxygen Therapy: Patient Spontanous Breathing  Post-op Pain: none  Post-op Assessment: Post-op Vital signs reviewed, Patient's Cardiovascular Status Stable, Respiratory Function Stable, Patent Airway, No signs of Nausea or vomiting and Pain level controlled  Post-op Vital Signs: Reviewed and stable  Complications: No apparent anesthesia complications

## 2012-06-22 NOTE — Anesthesia Preprocedure Evaluation (Addendum)
Anesthesia Evaluation  Patient identified by MRN, date of birth, ID band Patient awake    Reviewed: Allergy & Precautions, H&P , Patient's Chart, lab work & pertinent test results, reviewed documented beta blocker date and time   History of Anesthesia Complications Negative for: history of anesthetic complications  Airway Mallampati: II TM Distance: >3 FB Neck ROM: full    Dental no notable dental hx.    Pulmonary neg pulmonary ROS,  breath sounds clear to auscultation  Pulmonary exam normal       Cardiovascular Exercise Tolerance: Good hypertension, + CAD negative cardio ROS  Rhythm:regular Rate:Normal     Neuro/Psych negative neurological ROS  negative psych ROS   GI/Hepatic negative GI ROS, Neg liver ROS, GERD-  Controlled,  Endo/Other  negative endocrine ROS  Renal/GU negative Renal ROS     Musculoskeletal   Abdominal   Peds  Hematology negative hematology ROS (+)   Anesthesia Other Findings   Reproductive/Obstetrics negative OB ROS                          Anesthesia Physical Anesthesia Plan  ASA: III  Anesthesia Plan: General LMA   Post-op Pain Management:    Induction:   Airway Management Planned:   Additional Equipment:   Intra-op Plan:   Post-operative Plan:   Informed Consent: I have reviewed the patients History and Physical, chart, labs and discussed the procedure including the risks, benefits and alternatives for the proposed anesthesia with the patient or authorized representative who has indicated his/her understanding and acceptance.   Dental Advisory Given  Plan Discussed with: CRNA, Surgeon and Anesthesiologist  Anesthesia Plan Comments:        Anesthesia Quick Evaluation

## 2012-06-22 NOTE — Anesthesia Postprocedure Evaluation (Signed)
  Anesthesia Post-op Note  Patient: Kristin Taylor  Procedure(s) Performed: Procedure(s): ANTERIOR REPAIR (CYSTOCELE) (N/A)  Patient Location: Women's Unit  Anesthesia Type:General  Level of Consciousness: awake, alert  and oriented  Airway and Oxygen Therapy: Patient Spontanous Breathing and Patient connected to nasal cannula oxygen  Post-op Pain: none  Post-op Assessment: Post-op Vital signs reviewed and Patient's Cardiovascular Status Stable  Post-op Vital Signs: Reviewed and stable  Complications: No apparent anesthesia complications

## 2012-06-22 NOTE — Transfer of Care (Signed)
Immediate Anesthesia Transfer of Care Note  Patient: Kristin Taylor  Procedure(s) Performed: Procedure(s): ANTERIOR REPAIR (CYSTOCELE) (N/A)  Patient Location: PACU  Anesthesia Type:General  Level of Consciousness: sedated  Airway & Oxygen Therapy: Patient Spontanous Breathing and Patient connected to nasal cannula oxygen  Post-op Assessment: Report given to PACU RN and Post -op Vital signs reviewed and stable  Post vital signs: stable  Complications: No apparent anesthesia complications

## 2012-06-23 ENCOUNTER — Encounter (HOSPITAL_COMMUNITY): Payer: Self-pay | Admitting: Obstetrics and Gynecology

## 2012-06-23 NOTE — Progress Notes (Signed)
Vaginal packing removed. Pt tolerated well.  Scant amount of blood noted on packing.

## 2012-06-23 NOTE — Progress Notes (Signed)
ambulated Out   Discharge    Teaching    Complete

## 2012-06-23 NOTE — Op Note (Signed)
Kristin Taylor, Kristin Taylor                   ACCOUNT NO.:  [id number]  MEDICAL RECORD NO.:  [id number]  LOCATION:  9302                          FACILITY:  [facility]  PHYSICIAN:  Miguel Aschoff, M.D.       DATE OF BIRTH:  Mar 15, 1945  DATE OF PROCEDURE:  06/22/2012 DATE OF DISCHARGE:  06/22/2012                              OPERATIVE REPORT   PREOPERATIVE DIAGNOSIS:  Symptomatic cystocele.  POSTOPERATIVE DIAGNOSIS:  Symptomatic cystocele.  PROCEDURE:  Anterior colporrhaphy and cystocele repair.  SURGEON:  Miguel Aschoff, M.D.  ASSISTANT:  Luvenia Redden, M.D.  ANESTHESIA:  General.  COMPLICATIONS:  None.  JUSTIFICATION:  The patient is a 67 year old black female who presented to the office with a complaint of pelvic pressure and pain and something bulging through the vagina.  On examination, she was noted to have a grade 3 midline cystocele.  It is not associated with stress incontinence.  Due to this pelvic floor defect and the symptomatology associated with it, the patient had requested the repair be done and she presents to the operating room at this time to undergo anterior colporrhaphy.  The risks and benefits of procedure were discussed with the patient.  Informed consent has been obtained.  DESCRIPTION OF PROCEDURE:  The patient was taken to the operating room, placed in the supine position and general anesthesia was administered without difficulty.  She was then placed in the dorsal lithotomy position.  Prepped and draped in the usual sterile fashion.  After this was done, a Foley catheter was inserted.  Weighted speculum was then placed in the vaginal vault.  The anterior vaginal wall was inspected and Allis clamps were placed in the midline of the large cystocele. This Allis clamps ran from the apex of the vaginal cuff within 2.5 cm of the urethra.  An incision was then made in the midline and the vaginal mucosa and the cystocele was dissected free from the overlying vaginal mucosa.   This was done without difficulty without any injury to the bladder.  Prior to making the incisions, the anterior wall was injected with 1% Xylocaine with epinephrine for hemostasis.  Once the cystocele was completely outlined and freed from the mucosa, serial pursestring sutures initially with 0 Vicryl were placed, each concentric circle reducing the cystocele further.  Once it was reduced significantly, the remaining sutures were interrupted with 2-0 Vicryl sutures, taking the paravesical fascia and reinforcing it over the repaired cystocele.  At this point, the excess vaginal mucosa was trimmed and the mucosa was reapproximated using running interlocking 2-0 Vicryl suture.  Once the anterior wall was closed, final inspection was made for hemostasis. Hemostasis was excellent.  An Iodoform pack was then placed in the vagina and the procedure completed.  The estimated blood loss was minimal.  The patient was reversed from the anesthetic and taken to the recovery room in satisfactory condition. She will be observed overnight.  The plan is for the patient to be discharged home on June 23, 2012.     Miguel Aschoff, M.D.     AR/MEDQ  D:  06/22/2012  T:  06/23/2012  Job:  [id number]

## 2012-06-23 NOTE — Progress Notes (Signed)
Patient ID: Kristin Taylor, female   DOB: 1945-07-03, 67 y.o.   MRN: [id number]   S: Doing well this AM no problems since anterior repair. Very little pain. Taking regular diet  O: Afebrile V/S stable  Abdomen soft non tender  A: excellent post op course  Plan:  Discharge home  Regular Diet Resume all prior meds Post op visit in 4 weeks Meds for home  Vicodin 5/300 prn pain Condition: Improved

## 2012-06-29 NOTE — Discharge Summary (Signed)
[name], Kristin Taylor                   ACCOUNT NO.:  [id number]  MEDICAL RECORD NO.:  [id number]  LOCATION:  9302                          FACILITY:  [facility]  PHYSICIAN:  Miguel Aschoff, M.D.       DATE OF BIRTH:  [birth year]  DATE OF ADMISSION:  06/22/2012 DATE OF DISCHARGE:  06/23/2012                              DISCHARGE SUMMARY   ADMISSION DIAGNOSIS:  Symptomatic cystocele.  FINAL DIAGNOSIS:  Symptomatic cystocele.  OPERATIONS AND PROCEDURES:  Anterior colporrhaphy.  BRIEF HISTORY:  The patient is a 67 year old black female, who presented to the office with complaint of something bulging through the vagina. On examination, she was noted to have a large cystocele that was causing her discomfort and appeared to be the protrusion the patient had noticed.  Because of the symptomatology associated with this pelvic floor defect, the patient had requested that a repair be performed and she was admitted to the hospital on June 22, 2012, to undergo anterior colporrhaphy.  Preoperative studies were obtained.  This included a chemistry profile, which was essentially within normal limits. Admission hemoglobin was 13.2, hematocrit 39.7, white count 8100.  PT and PTT were within normal limits.  HOSPITAL COURSE:  Under general anesthesia on June 22, 2012, anterior colporrhaphy was carried out without difficulty with repair of the large cystocele.  The patient's postoperative course was essentially uncomplicated.  She was observed overnight with Foley catheter and a vaginal pack in place, and by the morning of June 23, 2012, the pack was removed as well as the catheter and the patient was able to void and ambulate well, and tolerating p.o. pain medications without difficulty. She was felt stable enough to be discharged home.  Medications for home included Vicodin 1 every 3 hours as needed for pain.  She was instructed to resume all prior medications, place nothing in the vagina.  We will set a  follow up visit after 4 weeks and to call for any problems such as fever, pain, or heavy bleeding.  She is sent home in satisfactory condition on a regular diet.     Miguel Aschoff, M.D.     AR/MEDQ  D:  06/28/2012  T:  06/29/2012  Job:  [id number]

## 2012-08-19 ENCOUNTER — Other Ambulatory Visit (HOSPITAL_COMMUNITY): Payer: Self-pay | Admitting: Family Medicine

## 2012-08-19 DIAGNOSIS — Z0000 Encounter for general adult medical examination without abnormal findings: Secondary | ICD-10-CM

## 2012-08-23 ENCOUNTER — Ambulatory Visit (HOSPITAL_COMMUNITY)
Admission: RE | Admit: 2012-08-23 | Discharge: 2012-08-23 | Disposition: A | Discharge status: Home / Self Care | Payer: Medicare Other | Source: Ambulatory Visit | Attending: Family Medicine | Admitting: Family Medicine

## 2012-08-23 ENCOUNTER — Other Ambulatory Visit (HOSPITAL_COMMUNITY): Payer: 59

## 2012-08-23 DIAGNOSIS — Z780 Asymptomatic menopausal state: Secondary | ICD-10-CM | POA: Insufficient documentation

## 2012-08-23 DIAGNOSIS — Z0000 Encounter for general adult medical examination without abnormal findings: Secondary | ICD-10-CM

## 2013-02-11 ENCOUNTER — Telehealth: Payer: Self-pay | Admitting: Cardiovascular Disease

## 2013-02-11 NOTE — Telephone Encounter (Signed)
Please call-concerning her medicine.Please call before 4:00.

## 2013-02-11 NOTE — Telephone Encounter (Signed)
Changing from [facility] to [facility] 03/10/2013.  [employer] will not cover Nexium. Asked that we call for an authorization.  Called [phone] info given - request has been faxed several times but no response in the 72 hour time frame and was told I needed to call another # [phone] w/ref [phone].  Only received voice prompts and since this is a future policy we need to call back after 03/10/13.  ID #[id number].

## 2013-03-01 MED ORDER — PANTOPRAZOLE SODIUM 40 MG PO TBEC: 40.0000 mg | DELAYED_RELEASE_TABLET | Freq: Every day | ORAL | Status: DC

## 2013-03-01 NOTE — Telephone Encounter (Signed)
Left message that her insurance company will not cover the Nexium, so Franky Macho has changed the prescription to pantoprazole 40 mg. Prescription sent to [facility].

## 2013-03-10 ENCOUNTER — Encounter (HOSPITAL_COMMUNITY): Payer: Self-pay | Admitting: Emergency Medicine

## 2013-03-10 DIAGNOSIS — Z8719 Personal history of other diseases of the digestive system: Secondary | ICD-10-CM | POA: Insufficient documentation

## 2013-03-10 DIAGNOSIS — Z7982 Long term (current) use of aspirin: Secondary | ICD-10-CM | POA: Insufficient documentation

## 2013-03-10 DIAGNOSIS — K219 Gastro-esophageal reflux disease without esophagitis: Secondary | ICD-10-CM | POA: Insufficient documentation

## 2013-03-10 DIAGNOSIS — Z87891 Personal history of nicotine dependence: Secondary | ICD-10-CM | POA: Insufficient documentation

## 2013-03-10 DIAGNOSIS — K5289 Other specified noninfective gastroenteritis and colitis: Secondary | ICD-10-CM | POA: Insufficient documentation

## 2013-03-10 DIAGNOSIS — K921 Melena: Secondary | ICD-10-CM | POA: Insufficient documentation

## 2013-03-10 DIAGNOSIS — Z8709 Personal history of other diseases of the respiratory system: Secondary | ICD-10-CM | POA: Insufficient documentation

## 2013-03-10 DIAGNOSIS — Z79899 Other long term (current) drug therapy: Secondary | ICD-10-CM | POA: Insufficient documentation

## 2013-03-10 DIAGNOSIS — Z951 Presence of aortocoronary bypass graft: Secondary | ICD-10-CM | POA: Insufficient documentation

## 2013-03-10 DIAGNOSIS — I10 Essential (primary) hypertension: Secondary | ICD-10-CM | POA: Insufficient documentation

## 2013-03-10 NOTE — ED Notes (Signed)
The pt is c/o lower abd [pain for 3-4 days.  She just noticed bright red blood in her stool today.  Hx of colitis

## 2013-03-11 ENCOUNTER — Encounter (HOSPITAL_COMMUNITY): Payer: Self-pay | Admitting: Radiology

## 2013-03-11 ENCOUNTER — Emergency Department (HOSPITAL_COMMUNITY): Payer: Medicare HMO

## 2013-03-11 ENCOUNTER — Emergency Department (HOSPITAL_COMMUNITY)
Admission: EM | Admit: 2013-03-11 | Discharge: 2013-03-11 | Disposition: A | Discharge status: Home / Self Care | Payer: Medicare HMO | Attending: Emergency Medicine | Admitting: Emergency Medicine

## 2013-03-11 DIAGNOSIS — K529 Noninfective gastroenteritis and colitis, unspecified: Secondary | ICD-10-CM

## 2013-03-11 LAB — URINALYSIS, ROUTINE W REFLEX MICROSCOPIC
BILIRUBIN URINE: NEGATIVE
Glucose, UA: NEGATIVE mg/dL
Ketones, ur: NEGATIVE mg/dL
Leukocytes, UA: NEGATIVE
Nitrite: NEGATIVE
Protein, ur: NEGATIVE mg/dL
SPECIFIC GRAVITY, URINE: 1.036 — AB (ref 1.005–1.030)
UROBILINOGEN UA: 1 mg/dL (ref 0.0–1.0)
pH: 5.5 (ref 5.0–8.0)

## 2013-03-11 LAB — COMPREHENSIVE METABOLIC PANEL
ALT: 18 U/L (ref 0–35)
AST: 18 U/L (ref 0–37)
Albumin: 3.9 g/dL (ref 3.5–5.2)
Alkaline Phosphatase: 91 U/L (ref 39–117)
BUN: 18 mg/dL (ref 6–23)
CALCIUM: 10.6 mg/dL — AB (ref 8.4–10.5)
CO2: 25 mEq/L (ref 19–32)
Chloride: 105 mEq/L (ref 96–112)
Creatinine, Ser: 0.57 mg/dL (ref 0.50–1.10)
GFR calc non Af Amer: >90 mL/min (ref >90)
GLUCOSE: 104 mg/dL — AB (ref 70–99)
Potassium: 4.2 mEq/L (ref 3.7–5.3)
Sodium: 141 mEq/L (ref 137–147)
TOTAL PROTEIN: 7.9 g/dL (ref 6.0–8.3)
Total Bilirubin: 0.3 mg/dL (ref 0.3–1.2)

## 2013-03-11 LAB — CBC WITH DIFFERENTIAL/PLATELET
BASOS ABS: 0.1 10*3/uL (ref 0.0–0.1)
Basophils Absolute: 0.1 10*3/uL (ref 0.0–0.1)
Basophils Relative: 1 % (ref 0–1)
Basophils Relative: 1 % (ref 0–1)
EOS ABS: 0.1 10*3/uL (ref 0.0–0.7)
EOS PCT: 1 % (ref 0–5)
EOS PCT: 1 % (ref 0–5)
Eosinophils Absolute: 0.1 10*3/uL (ref 0.0–0.7)
HCT: 41.6 % (ref 36.0–46.0)
HEMATOCRIT: 38.8 % (ref 36.0–46.0)
HEMOGLOBIN: 13.1 g/dL (ref 12.0–15.0)
HEMOGLOBIN: 14.2 g/dL (ref 12.0–15.0)
LYMPHS ABS: 2.2 10*3/uL (ref 0.7–4.0)
LYMPHS PCT: 20 % (ref 12–46)
Lymphocytes Relative: 21 % (ref 12–46)
Lymphs Abs: 2.2 10*3/uL (ref 0.7–4.0)
MCH: 30.5 pg (ref 26.0–34.0)
MCH: 30.9 pg (ref 26.0–34.0)
MCHC: 33.8 g/dL (ref 30.0–36.0)
MCHC: 34.1 g/dL (ref 30.0–36.0)
MCV: 90.4 fL (ref 78.0–100.0)
MCV: 90.4 fL (ref 78.0–100.0)
MONO ABS: 1 10*3/uL (ref 0.1–1.0)
MONO ABS: 1 10*3/uL (ref 0.1–1.0)
MONOS PCT: 9 % (ref 3–12)
MONOS PCT: 9 % (ref 3–12)
Neutro Abs: 7.3 10*3/uL (ref 1.7–7.7)
Neutro Abs: 7.5 10*3/uL (ref 1.7–7.7)
Neutrophils Relative %: 69 % (ref 43–77)
Neutrophils Relative %: 69 % (ref 43–77)
PLATELETS: 299 10*3/uL (ref 150–400)
Platelets: 272 10*3/uL (ref 150–400)
RBC: 4.29 MIL/uL (ref 3.87–5.11)
RBC: 4.6 MIL/uL (ref 3.87–5.11)
RDW: 13.2 % (ref 11.5–15.5)
RDW: 13.3 % (ref 11.5–15.5)
WBC: 10.6 10*3/uL — ABNORMAL HIGH (ref 4.0–10.5)
WBC: 10.9 10*3/uL — AB (ref 4.0–10.5)

## 2013-03-11 LAB — URINE MICROSCOPIC-ADD ON

## 2013-03-11 LAB — LIPASE, BLOOD: LIPASE: 27 U/L (ref 11–59)

## 2013-03-11 LAB — BASIC METABOLIC PANEL
BUN: 16 mg/dL (ref 6–23)
CALCIUM: 10 mg/dL (ref 8.4–10.5)
CO2: 26 meq/L (ref 19–32)
CREATININE: 0.55 mg/dL (ref 0.50–1.10)
Chloride: 106 mEq/L (ref 96–112)
GFR calc Af Amer: >90 mL/min (ref >90)
GFR calc non Af Amer: >90 mL/min (ref >90)
Glucose, Bld: 102 mg/dL — ABNORMAL HIGH (ref 70–99)
Potassium: 4.5 mEq/L (ref 3.7–5.3)
Sodium: 141 mEq/L (ref 137–147)

## 2013-03-11 MED ORDER — SODIUM CHLORIDE 0.9 % IV SOLN
Freq: Once | INTRAVENOUS | Status: AC
  Administered 2013-03-11: 04:00:00 via INTRAVENOUS

## 2013-03-11 MED ORDER — HYDROCODONE-ACETAMINOPHEN 5-325 MG PO TABS: 1.0000 | ORAL_TABLET | ORAL | Status: DC | PRN

## 2013-03-11 MED ORDER — MORPHINE SULFATE 4 MG/ML IJ SOLN
4.0000 mg | Freq: Once | INTRAMUSCULAR | Status: AC
  Administered 2013-03-11: 4 mg via INTRAVENOUS
  Filled 2013-03-11: qty 1

## 2013-03-11 MED ORDER — IOHEXOL 300 MG/ML  SOLN
100.0000 mL | Freq: Once | INTRAMUSCULAR | Status: AC | PRN
  Administered 2013-03-11: 100 mL via INTRAVENOUS

## 2013-03-11 MED ORDER — IOHEXOL 300 MG/ML  SOLN
25.0000 mL | INTRAMUSCULAR | Status: AC
  Administered 2013-03-11 (×2): 25 mL via ORAL

## 2013-03-11 NOTE — ED Notes (Signed)
Pt finished and tolerated oral contrast well. CT notified.

## 2013-03-11 NOTE — ED Provider Notes (Signed)
CSN: [id number]     Arrival date & time 03/10/13  2323 History   First MD Initiated Contact with Patient 03/11/13 0327     Chief Complaint  Patient presents with  . Abdominal Pain    HPI Patient reports she developed lower abdominal pain and some loose diarrheal stools this afternoon.  This evening her pain became more severe and she had another bowel movement which also included some blood.  She presents now because of increasing pain in her lower abdomen and left lower quadrant.  She has a history of ischemic colitis and states this feels similar to her episode many years ago.  She denies fevers and chills.  No nausea or vomiting.  She's on aspirin but otherwise no anticoagulants.   Past Medical History  Diagnosis Date  . GERD (gastroesophageal reflux disease)   . Hypertension   . Shortness of breath     on exertion   Past Surgical History  Procedure Laterality Date  . Coronary artery bypass graft  2001  . Cardiac catheterization  2001  . Abdominal hysterectomy  1998  . Anterior and posterior repair    . Cystocele repair N/A 06/22/2012    Procedure: ANTERIOR REPAIR (CYSTOCELE);  Surgeon: Gus Height, MD;  Location: [facility];  Service: Gynecology;  Laterality: N/A;   No family history on file. History  Substance Use Topics  . Smoking status: Former Smoker    Types: Cigarettes    Quit date: 03/11/1999  . Smokeless tobacco: Not on file  . Alcohol Use: 0.6 oz/week    1 Glasses of wine per week     Comment: rarely   OB History   Grav Para Term Preterm Abortions TAB SAB Ect Mult Living                 Review of Systems  All other systems reviewed and are negative.    Allergies  Review of patient's allergies indicates no known allergies.  Home Medications   Current Outpatient Rx  Name  Route  Sig  Dispense  Refill  . aspirin EC 81 MG tablet   Oral   Take 81 mg by mouth daily.         Marland Kitchen atorvastatin (LIPITOR) 10 MG tablet   Oral   Take 10 mg by mouth daily.          . Biotin 5000 MCG CAPS   Oral   Take 1 capsule by mouth daily.         Marland Kitchen esomeprazole (NEXIUM) 40 MG capsule   Oral   Take 40 mg by mouth daily at 12 noon.         . ezetimibe (ZETIA) 10 MG tablet   Oral   Take 10 mg by mouth daily.         . fish oil-omega-3 fatty acids 1000 MG capsule   Oral   Take 1 g by mouth 4 (four) times daily.         . Multiple Vitamin (MULTIVITAMIN WITH MINERALS) TABS   Oral   Take 1 tablet by mouth daily.         . vitamin C (ASCORBIC ACID) 500 MG tablet   Oral   Take 500 mg by mouth daily.          BP 143/72  Pulse 58  Temp(Src) 97.7 F (36.5 C) (Oral)  Resp 16  Wt 157 lb 6 oz (71.385 kg)  SpO2 100% Physical Exam  Nursing note and  vitals reviewed. Constitutional: She is oriented to person, place, and time. She appears well-developed and well-nourished. No distress.  HENT:  Head: Normocephalic and atraumatic.  Eyes: EOM are normal.  Neck: Normal range of motion.  Cardiovascular: Normal rate, regular rhythm and normal heart sounds.   Pulmonary/Chest: Effort normal and breath sounds normal.  Abdominal: Soft. She exhibits no distension.  Mild lower abdominal tenderness with more tenderness in the left lower quadrant.  No peritoneal signs  Musculoskeletal: Normal range of motion.  Neurological: She is alert and oriented to person, place, and time.  Skin: Skin is warm and dry.  Psychiatric: She has a normal mood and affect. Judgment normal.    ED Course  Procedures (including critical care time) Labs Review Labs Reviewed  URINALYSIS, ROUTINE W REFLEX MICROSCOPIC - Abnormal; Notable for the following:    APPearance CLOUDY (*)    Specific Gravity, Urine 1.036 (*)    Hgb urine dipstick SMALL (*)    All other components within normal limits  CBC WITH DIFFERENTIAL - Abnormal; Notable for the following:    WBC 10.6 (*)    All other components within normal limits  COMPREHENSIVE METABOLIC PANEL - Abnormal; Notable for the  following:    Glucose, Bld 104 (*)    Calcium 10.6 (*)    All other components within normal limits  URINE MICROSCOPIC-ADD ON - Abnormal; Notable for the following:    Squamous Epithelial / LPF FEW (*)    Crystals CA OXALATE CRYSTALS (*)    All other components within normal limits  CBC WITH DIFFERENTIAL - Abnormal; Notable for the following:    WBC 10.9 (*)    All other components within normal limits  BASIC METABOLIC PANEL - Abnormal; Notable for the following:    Glucose, Bld 102 (*)    All other components within normal limits  LIPASE, BLOOD   Imaging Review No results found.  EKG Interpretation   None       MDM  No diagnosis found. Sounds like this could represent colitis.  Given her left lower quadrant pain a CT scan will be performed.  This could be diverticulitis.  8:11 AM Patient feels great at this time.  Abdominal exam is benign.  Patient is nontoxic-appearing.  This appears to be a colitis.  I don't think the patient needs additional testing at this time.  She is well appearing.  She's had no more diarrhea or blood while in the ER.  Her vital signs are normal.  Her hemoglobin is stable.  Outpatient GI followup with PCP followup.  She understands return to the ER for new or worsening symptoms.  No indication for antibiotics at this time.  No recent travel no recent antibiotics.    Hoy Morn, MD 03/11/13 [phone]

## 2013-04-11 ENCOUNTER — Encounter: Payer: Self-pay | Admitting: Internal Medicine

## 2013-04-28 ENCOUNTER — Ambulatory Visit (INDEPENDENT_AMBULATORY_CARE_PROVIDER_SITE_OTHER): Payer: Managed Care, Other (non HMO) | Admitting: Gastroenterology

## 2013-04-28 ENCOUNTER — Encounter (INDEPENDENT_AMBULATORY_CARE_PROVIDER_SITE_OTHER): Payer: Self-pay

## 2013-04-28 ENCOUNTER — Encounter: Payer: Self-pay | Admitting: Gastroenterology

## 2013-04-28 VITALS — BP 140/78 | HR 49 | Temp 97.6°F | Ht 67.0 in | Wt 160.2 lb

## 2013-04-28 DIAGNOSIS — K5900 Constipation, unspecified: Secondary | ICD-10-CM | POA: Insufficient documentation

## 2013-04-28 DIAGNOSIS — K5010 Crohn's disease of large intestine without complications: Secondary | ICD-10-CM | POA: Insufficient documentation

## 2013-04-28 DIAGNOSIS — K219 Gastro-esophageal reflux disease without esophagitis: Secondary | ICD-10-CM

## 2013-04-28 MED ORDER — LUBIPROSTONE 24 MCG PO CAPS: 24.0000 ug | ORAL_CAPSULE | Freq: Two times a day (BID) | ORAL | Status: DC

## 2013-04-28 MED ORDER — PEG 3350-KCL-NA BICARB-NACL 420 G PO SOLR: 4000.0000 mL | ORAL | Status: DC

## 2013-04-28 NOTE — Patient Instructions (Signed)
1. While having breakthrough heartburn, take Nexium once daily BEFORE breakfast. It works better if taken on empty stomach then followed by a meal. 2. Start Amitiza one capsule with food twice daily for constipation. Samples provided. RX sent to your pharmacy. 3. Colonoscopy and upper endoscopy with Dr. Gala Romney. See separate instructions.

## 2013-04-28 NOTE — Assessment & Plan Note (Signed)
Chronic GERD. Some breakthrough symptoms but not taking PPI daily. GERD since 2001 on PPI. Consider one time EGD at this time to rule out complicated GERD.  I have discussed the risks, alternatives, benefits with regards to but not limited to the risk of reaction to medication, bleeding, infection, perforation and the patient is agreeable to proceed. Written consent to be obtained.  Encouraged to take Nexium daily before breakfast at this time.

## 2013-04-28 NOTE — Progress Notes (Signed)
Primary Care Physician:  Glo Herring., MD  Primary Gastroenterologist:  Garfield Cornea, MD   Chief Complaint  Patient presents with  . Diverticulitis    HPI:  Kristin Taylor is a 68 y.o. female here for further evaluation of colitis. We last saw patient at time of high risk screening colonoscopy in 2007. Her mother had colon cancer in her late 60s/early 14s. Patient had two cecal diverticula but otherwise normal. In 2009, she presented with left sided abdominal pain, rectal bleeding. 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 Watt Climes. She was noted to have marked narrowing at 60cm and exam stopped due to patient discomfort, increasing inflammation, and lumen narrowing.   Patient developed acute onset left sided abd pain (upper abd), diarrhea, blood in stool which led to ER visit on 03/11/13. CT showed mild wall thickening involving 10cm segment of descending colon with subtle adjacent inflammatory change. She was given seven day course of Cipro/Flagyl by her PCP. No further abdominal pain. Has some burning with spicy foods, fruit. Burning left abdomen. No further rectal bleeding. Prior to getting sick, BM twice per week. At this point BM 3-4 times per week with occasional loose stool. Chronically constipation. No melena. No fever. No ill contacts. No bad food, travel abroad, or antibiotics prior this. Since this illness, more indigestion. Taking the Nexium after breakfast, every other day only. Switching to generic nexium with next bottle. No dysphagia, no vomiting. On PPI since 2001. No ASA powders. No frequent NSAIDs.   Current Outpatient Prescriptions  Medication Sig Dispense Refill  . aspirin EC 81 MG tablet Take 81 mg by mouth daily.      Marland Kitchen atorvastatin (LIPITOR) 10 MG tablet Take 10 mg by mouth daily.      Marland Kitchen BLACK COHOSH PO Take by mouth.      . calcium carbonate (OS-CAL) 600 MG TABS tablet Take 600 mg by mouth 2 (two) times daily with a meal.      . esomeprazole (NEXIUM) 40 MG capsule Take  40 mg by mouth. One every other day      . ezetimibe (ZETIA) 10 MG tablet Take 10 mg by mouth daily.      . fish oil-omega-3 fatty acids 1000 MG capsule Take 1 g by mouth 4 (four) times daily.      . metoprolol (LOPRESSOR) 50 MG tablet Take 50 mg by mouth. 1.5 tablet daily      . Multiple Vitamin (MULTIVITAMIN WITH MINERALS) TABS Take 1 tablet by mouth daily.      . vitamin C (ASCORBIC ACID) 500 MG tablet Take 500 mg by mouth daily.       No current facility-administered medications for this visit.    Allergies as of 04/28/2013  . (No Known Allergies)    Past Medical History  Diagnosis Date  . GERD (gastroesophageal reflux disease)   . Hypertension   . Shortness of breath     on exertion  . Glaucoma     Past Surgical History  Procedure Laterality Date  . Coronary artery bypass graft  2001  . Cardiac catheterization  2001  . Abdominal hysterectomy  1998  . Anterior and posterior repair    . Cystocele repair N/A 06/22/2012    Procedure: ANTERIOR REPAIR (CYSTOCELE);  Surgeon: Gus Height, MD;  Location: [facility];  Service: Gynecology;  Laterality: N/A;  . Colonoscopy  07/22/2005    RMR:Two cecal diverticula.  Remainder of the colonic mucosa appeared normal, normal rectum.  Marland Kitchen  Flexible sigmoidoscopy  2009    Dr. Watt Climes: ischemic colitis    Family History  Problem Relation Age of Onset  . Colon cancer Mother     age late 60s/early 54s.    History   Social History  . Marital Status: Divorced    Spouse Name: N/A    Number of Children: 2  . Years of Education: N/A   Occupational History  . PT at church    Social History Main Topics  . Smoking status: Former Smoker    Types: Cigarettes    Quit date: 03/11/1999  . Smokeless tobacco: Not on file  . Alcohol Use: 0.6 oz/week    1 Glasses of wine per week     Comment: rarely  . Drug Use: No  . Sexual Activity: Not on file   Other Topics Concern  . Not on file   Social History Narrative  . No narrative on file       ROS:  General: Negative for anorexia, weight loss, fever, chills, fatigue, weakness. Eyes: Negative for vision changes.  ENT: Negative for hoarseness, difficulty swallowing , nasal congestion. CV: Negative for chest pain, angina, palpitations, dyspnea on exertion, peripheral edema.  Respiratory: Negative for dyspnea at rest, dyspnea on exertion, cough, sputum, wheezing.  GI: See history of present illness. GU:  Negative for dysuria, hematuria, urinary incontinence, urinary frequency, nocturnal urination.  MS: Negative for joint pain, low back pain.  Derm: Negative for rash or itching.  Neuro: Negative for weakness, abnormal sensation, seizure, frequent headaches, memory loss, confusion.  Psych: Negative for anxiety, depression, suicidal ideation, hallucinations.  Endo: Negative for unusual weight change.  Heme: Negative for bruising or bleeding. Allergy: Negative for rash or hives.    Physical Examination:  BP 140/78  Pulse 49  Temp(Src) 97.6 F (36.4 C) (Oral)  Ht 5\' 7"  (1.702 m)  Wt 160 lb 3.2 oz (72.666 kg)  BMI 25.08 kg/m2   General: Well-nourished, well-developed in no acute distress.  Head: Normocephalic, atraumatic.   Eyes: Conjunctiva pink, no icterus. Mouth: Oropharyngeal mucosa moist and pink , no lesions erythema or exudate. Neck: Supple without thyromegaly, masses, or lymphadenopathy.  Lungs: Clear to auscultation bilaterally.  Heart: Regular rate and rhythm, no murmurs rubs or gallops.  Abdomen: Bowel sounds are normal, nontender, nondistended, no hepatosplenomegaly or masses, no abdominal bruits or    hernia , no rebound or guarding.   Rectal: not performed Extremities: No lower extremity edema. No clubbing or deformities.  Neuro: Alert and oriented x 4 , grossly normal neurologically.  Skin: Warm and dry, no rash or jaundice.   Psych: Alert and cooperative, normal mood and affect.  Labs: Lab Results  Component Value Date   ALT 18 03/10/2013   AST 18  03/10/2013   ALKPHOS 91 03/10/2013   BILITOT 0.3 03/10/2013   Lab Results  Component Value Date   CREATININE 0.55 03/11/2013   BUN 16 03/11/2013   NA 141 03/11/2013   K 4.5 03/11/2013   CL 106 03/11/2013   CO2 26 03/11/2013   Lab Results  Component Value Date   WBC 10.9* 03/11/2013   HGB 13.1 03/11/2013   HCT 38.8 03/11/2013   MCV 90.4 03/11/2013   PLT 272 03/11/2013     Imaging Studies: CT abdomen pelvis with contrast on 03/11/2013 Impression: Mild wall thickening involving a 10 cm segment of the descending colon with only subtle adjacent inflammatory changes findings are likely due to a mild acute colitis. Minimal diverticulosis  of the colon. Mild cardiomegaly. Mild atherosclerotic disease of the abdominal aorta and iliac vessels.

## 2013-04-28 NOTE — Assessment & Plan Note (Signed)
Recent abdominal pain associated with bloody diarrhea. Suspect ischemic colitis. Less likely IBD given resolution of symptoms. Needs colonoscopy for further evaluation. She had some narrowing of lumen in 2009 at time of flex sig. No mention of stricturing on CT.  I have discussed the risks, alternatives, benefits with regards to but not limited to the risk of reaction to medication, bleeding, infection, perforation and the patient is agreeable to proceed. Written consent to be obtained.

## 2013-04-28 NOTE — Assessment & Plan Note (Signed)
Chronically constipated. Start Amitiza 78mcg BID.

## 2013-04-29 ENCOUNTER — Encounter (HOSPITAL_COMMUNITY): Payer: Self-pay | Admitting: Pharmacy Technician

## 2013-05-04 NOTE — Progress Notes (Signed)
Cc to pcp °

## 2013-05-11 ENCOUNTER — Other Ambulatory Visit: Payer: Self-pay | Admitting: Cardiovascular Disease

## 2013-05-11 ENCOUNTER — Ambulatory Visit (HOSPITAL_COMMUNITY)
Admission: RE | Admit: 2013-05-11 | Discharge: 2013-05-11 | Disposition: A | Discharge status: Home / Self Care | Payer: Medicare HMO | Source: Ambulatory Visit | Attending: Internal Medicine | Admitting: Internal Medicine

## 2013-05-11 ENCOUNTER — Encounter (HOSPITAL_COMMUNITY)
Admission: RE | Disposition: A | Discharge status: Home / Self Care | Payer: Self-pay | Source: Ambulatory Visit | Attending: Internal Medicine

## 2013-05-11 ENCOUNTER — Encounter (HOSPITAL_COMMUNITY): Payer: Self-pay | Admitting: *Deleted

## 2013-05-11 DIAGNOSIS — R933 Abnormal findings on diagnostic imaging of other parts of digestive tract: Secondary | ICD-10-CM | POA: Insufficient documentation

## 2013-05-11 DIAGNOSIS — D126 Benign neoplasm of colon, unspecified: Secondary | ICD-10-CM

## 2013-05-11 DIAGNOSIS — Z79899 Other long term (current) drug therapy: Secondary | ICD-10-CM | POA: Insufficient documentation

## 2013-05-11 DIAGNOSIS — Z800 Family history of malignant neoplasm of digestive organs: Secondary | ICD-10-CM | POA: Insufficient documentation

## 2013-05-11 DIAGNOSIS — I10 Essential (primary) hypertension: Secondary | ICD-10-CM | POA: Insufficient documentation

## 2013-05-11 DIAGNOSIS — Z7982 Long term (current) use of aspirin: Secondary | ICD-10-CM | POA: Insufficient documentation

## 2013-05-11 DIAGNOSIS — K625 Hemorrhage of anus and rectum: Secondary | ICD-10-CM

## 2013-05-11 DIAGNOSIS — K219 Gastro-esophageal reflux disease without esophagitis: Secondary | ICD-10-CM

## 2013-05-11 DIAGNOSIS — K5900 Constipation, unspecified: Secondary | ICD-10-CM

## 2013-05-11 DIAGNOSIS — Z951 Presence of aortocoronary bypass graft: Secondary | ICD-10-CM | POA: Insufficient documentation

## 2013-05-11 DIAGNOSIS — K5010 Crohn's disease of large intestine without complications: Secondary | ICD-10-CM

## 2013-05-11 DIAGNOSIS — D131 Benign neoplasm of stomach: Secondary | ICD-10-CM | POA: Insufficient documentation

## 2013-05-11 DIAGNOSIS — K449 Diaphragmatic hernia without obstruction or gangrene: Secondary | ICD-10-CM | POA: Insufficient documentation

## 2013-05-11 HISTORY — PX: COLONOSCOPY WITH ESOPHAGOGASTRODUODENOSCOPY (EGD): SHX5779

## 2013-05-11 SURGERY — COLONOSCOPY WITH ESOPHAGOGASTRODUODENOSCOPY (EGD)
Anesthesia: Moderate Sedation

## 2013-05-11 MED ORDER — MEPERIDINE HCL 100 MG/ML IJ SOLN
INTRAMUSCULAR | Status: DC | PRN
  Administered 2013-05-11 (×2): 25 mg via INTRAVENOUS
  Administered 2013-05-11: 50 mg via INTRAVENOUS

## 2013-05-11 MED ORDER — MIDAZOLAM HCL 5 MG/5ML IJ SOLN
INTRAMUSCULAR | Status: DC | PRN
  Administered 2013-05-11: 2 mg via INTRAVENOUS
  Administered 2013-05-11: 1 mg via INTRAVENOUS
  Administered 2013-05-11: 2 mg via INTRAVENOUS

## 2013-05-11 MED ORDER — LIDOCAINE VISCOUS 2 % MT SOLN
OROMUCOSAL | Status: AC
  Filled 2013-05-11: qty 15

## 2013-05-11 MED ORDER — LIDOCAINE VISCOUS 2 % MT SOLN
OROMUCOSAL | Status: DC | PRN
  Administered 2013-05-11: 3 mL via OROMUCOSAL

## 2013-05-11 MED ORDER — ONDANSETRON HCL 4 MG/2ML IJ SOLN
INTRAMUSCULAR | Status: DC | PRN
  Administered 2013-05-11: 4 mg via INTRAVENOUS

## 2013-05-11 MED ORDER — EZETIMIBE 10 MG PO TABS: 10.0000 mg | ORAL_TABLET | Freq: Every day | ORAL | Status: DC

## 2013-05-11 MED ORDER — MIDAZOLAM HCL 5 MG/5ML IJ SOLN
INTRAMUSCULAR | Status: AC
  Filled 2013-05-11: qty 10

## 2013-05-11 MED ORDER — ONDANSETRON HCL 4 MG/2ML IJ SOLN
INTRAMUSCULAR | Status: AC
  Filled 2013-05-11: qty 2

## 2013-05-11 MED ORDER — STERILE WATER FOR IRRIGATION IR SOLN
Status: DC | PRN
  Administered 2013-05-11: 11:00:00

## 2013-05-11 MED ORDER — SODIUM CHLORIDE 0.9 % IV SOLN
INTRAVENOUS | Status: DC
  Administered 2013-05-11: 10:00:00 via INTRAVENOUS

## 2013-05-11 MED ORDER — MEPERIDINE HCL 100 MG/ML IJ SOLN
INTRAMUSCULAR | Status: AC
  Filled 2013-05-11: qty 2

## 2013-05-11 NOTE — Op Note (Signed)
[facility] [address] [place], [address]   COLONOSCOPY PROCEDURE REPORT  PATIENT: Kristin Taylor, Kristin Taylor  MR#:         [id number] BIRTHDATE: 1946/01/25 , 23  yrs. old GENDER: Female ENDOSCOPIST: R.  Garfield Cornea, MD FACP FACG REFERRED BY:  Kerin Perna, M.D. PROCEDURE DATE:  05/11/2013 PROCEDURE:     Colonoscopy with biopsy INDICATIONS: History of rectal bleeding and abnormal colon on CT  INFORMED CONSENT:  The risks, benefits, alternatives and imponderables including but not limited to bleeding, perforation as well as the possibility of a missed lesion have been reviewed.  The potential for biopsy, lesion removal, etc. have also been discussed.  Questions have been answered.  All parties agreeable. Please see the history and physical in the medical record for more information.  MEDICATIONS: Versed 5 mg IV and Demerol 100 mg IV in divided doses per Zofran 4 mg IV  DESCRIPTION OF PROCEDURE:  After a digital rectal exam was performed, the EG-[id number] ([id number]) and [id number] ([id number]) colonoscope was advanced from the anus through the rectum and colon to the area of the cecum, ileocecal valve and appendiceal orifice. The cecum was deeply intubated.  These structures were well-seen and photographed for the record.  From the level of the cecum and ileocecal valve, the scope was slowly and cautiously withdrawn. The mucosal surfaces were carefully surveyed utilizing scope tip deflection to facilitate fold flattening as needed.  The scope was pulled down into the rectum where a thorough examination including retroflexion was performed.    FINDINGS:  Adequate preparation. Small rectal vault. Unable to retroflex. However, rectal mucosa seen well on-face. (1) 4 mm cecal polyp; otherwise, the remainder of the colonic mucosa appeared normal.  THERAPEUTIC / DIAGNOSTIC MANEUVERS PERFORMED:  The above-mentioned polyp was cold biopsied/removed  COMPLICATIONS: none  CECAL  WITHDRAWAL TIME:  11 minutes  IMPRESSION:  Colonic polyp-removed as described above. Recent acute illness in CT findings likely related to a self-limiting inflammatory process.  RECOMMENDATIONS: Followup on pathology   _______________________________ eSigned:  R. Garfield Cornea, MD FACP [facility] 05/11/2013 11:52 AM   CC:

## 2013-05-11 NOTE — Discharge Instructions (Addendum)
Colonoscopy Discharge Instructions  Read the instructions outlined below and refer to this sheet in the next few weeks. These discharge instructions provide you with general information on caring for yourself after you leave the hospital. Your doctor may also give you specific instructions. While your treatment has been planned according to the most current medical practices available, unavoidable complications occasionally occur. If you have any problems or questions after discharge, call Dr. Gala Romney at [phone]. ACTIVITY  You may resume your regular activity, but move at a slower pace for the next 24 hours.   Take frequent rest periods for the next 24 hours.   Walking will help get rid of the air and reduce the bloated feeling in your belly (abdomen).   No driving for 24 hours (because of the medicine (anesthesia) used during the test).    Do not sign any important legal documents or operate any machinery for 24 hours (because of the anesthesia used during the test).  NUTRITION  Drink plenty of fluids.   You may resume your normal diet as instructed by your doctor.   Begin with a light meal and progress to your normal diet. Heavy or fried foods are harder to digest and may make you feel sick to your stomach (nauseated).   Avoid alcoholic beverages for 24 hours or as instructed.  MEDICATIONS  You may resume your normal medications unless your doctor tells you otherwise.  WHAT YOU CAN EXPECT TODAY  Some feelings of bloating in the abdomen.   Passage of more gas than usual.   Spotting of blood in your stool or on the toilet paper.  IF YOU HAD POLYPS REMOVED DURING THE COLONOSCOPY:  No aspirin products for 7 days or as instructed.   No alcohol for 7 days or as instructed.   Eat a soft diet for the next 24 hours.  FINDING OUT THE RESULTS OF YOUR TEST Not all test results are available during your visit. If your test results are not back during the visit, make an appointment  with your caregiver to find out the results. Do not assume everything is normal if you have not heard from your caregiver or the medical facility. It is important for you to follow up on all of your test results.  SEEK IMMEDIATE MEDICAL ATTENTION IF:  You have more than a spotting of blood in your stool.   Your belly is swollen (abdominal distention).   You are nauseated or vomiting.   You have a temperature over 101.   You have abdominal pain or discomfort that is severe or gets worse throughout the day.   EGD Discharge instructions Please read the instructions outlined below and refer to this sheet in the next few weeks. These discharge instructions provide you with general information on caring for yourself after you leave the hospital. Your doctor may also give you specific instructions. While your treatment has been planned according to the most current medical practices available, unavoidable complications occasionally occur. If you have any problems or questions after discharge, please call your doctor. ACTIVITY  You may resume your regular activity but move at a slower pace for the next 24 hours.   Take frequent rest periods for the next 24 hours.   Walking will help expel (get rid of) the air and reduce the bloated feeling in your abdomen.   No driving for 24 hours (because of the anesthesia (medicine) used during the test).   You may shower.   Do not sign any  important legal documents or operate any machinery for 24 hours (because of the anesthesia used during the test).  NUTRITION  Drink plenty of fluids.   You may resume your normal diet.   Begin with a light meal and progress to your normal diet.   Avoid alcoholic beverages for 24 hours or as instructed by your caregiver.  MEDICATIONS  You may resume your normal medications unless your caregiver tells you otherwise.  WHAT YOU CAN EXPECT TODAY  You may experience abdominal discomfort such as a feeling of  fullness or gas pains.  FOLLOW-UP  Your doctor will discuss the results of your test with you.  SEEK IMMEDIATE MEDICAL ATTENTION IF ANY OF THE FOLLOWING OCCUR:  Excessive nausea (feeling sick to your stomach) and/or vomiting.   Severe abdominal pain and distention (swelling).   Trouble swallowing.   Temperature over 101 F (37.8 C).   Rectal bleeding or vomiting of blood.   GERD information provided  Continue Nexium 40 mg daily  Polyp information provided  Further recommendations to follow pending review of pathology report  Gastroesophageal Reflux Disease, Adult Gastroesophageal reflux disease (GERD) happens when acid from your stomach flows up into the esophagus. When acid comes in contact with the esophagus, the acid causes soreness (inflammation) in the esophagus. Over time, GERD may create small holes (ulcers) in the lining of the esophagus. CAUSES   Increased body weight. This puts pressure on the stomach, making acid rise from the stomach into the esophagus.  Smoking. This increases acid production in the stomach.  Drinking alcohol. This causes decreased pressure in the lower esophageal sphincter (valve or ring of muscle between the esophagus and stomach), allowing acid from the stomach into the esophagus.  Late evening meals and a full stomach. This increases pressure and acid production in the stomach.  A malformed lower esophageal sphincter. Sometimes, no cause is found. SYMPTOMS   Burning pain in the lower part of the mid-chest behind the breastbone and in the mid-stomach area. This may occur twice a week or more often.  Trouble swallowing.  Sore throat.  Dry cough.  Asthma-like symptoms including chest tightness, shortness of breath, or wheezing. DIAGNOSIS  Your caregiver may be able to diagnose GERD based on your symptoms. In some cases, X-rays and other tests may be done to check for complications or to check the condition of your stomach and  esophagus. TREATMENT  Your caregiver may recommend over-the-counter or prescription medicines to help decrease acid production. Ask your caregiver before starting or adding any new medicines.  HOME CARE INSTRUCTIONS   Change the factors that you can control. Ask your caregiver for guidance concerning weight loss, quitting smoking, and alcohol consumption.  Avoid foods and drinks that make your symptoms worse, such as:  Caffeine or alcoholic drinks.  Chocolate.  Peppermint or mint flavorings.  Garlic and onions.  Spicy foods.  Citrus fruits, such as oranges, lemons, or limes.  Tomato-based foods such as sauce, chili, salsa, and pizza.  Fried and fatty foods.  Avoid lying down for the 3 hours prior to your bedtime or prior to taking a nap.  Eat small, frequent meals instead of large meals.  Wear loose-fitting clothing. Do not wear anything tight around your waist that causes pressure on your stomach.  Raise the head of your bed 6 to 8 inches with wood blocks to help you sleep. Extra pillows will not help.  Only take over-the-counter or prescription medicines for pain, discomfort, or fever as directed by  your caregiver.  Do not take aspirin, ibuprofen, or other nonsteroidal anti-inflammatory drugs (NSAIDs). SEEK IMMEDIATE MEDICAL CARE IF:   You have pain in your arms, neck, jaw, teeth, or back.  Your pain increases or changes in intensity or duration.  You develop nausea, vomiting, or sweating (diaphoresis).  You develop shortness of breath, or you faint.  Your vomit is green, yellow, black, or looks like coffee grounds or blood.  Your stool is red, bloody, or black. These symptoms could be signs of other problems, such as heart disease, gastric bleeding, or esophageal bleeding. MAKE SURE YOU:   Understand these instructions.  Will watch your condition.  Will get help right away if you are not doing well or get worse.   Colon Polyps Polyps are lumps of extra  tissue growing inside the body. Polyps can grow in the large intestine (colon). Most colon polyps are noncancerous (benign). However, some colon polyps can become cancerous over time. Polyps that are larger than a pea may be harmful. To be safe, caregivers remove and test all polyps. CAUSES  Polyps form when mutations in the genes cause your cells to grow and divide even though no more tissue is needed. RISK FACTORS There are a number of risk factors that can increase your chances of getting colon polyps. They include:  Being older than 50 years.  Family history of colon polyps or colon cancer.  Long-term colon diseases, such as colitis or Crohn disease.  Being overweight.  Smoking.  Being inactive.  Drinking too much alcohol. SYMPTOMS  Most small polyps do not cause symptoms. If symptoms are present, they may include:  Blood in the stool. The stool may look dark red or black.  Constipation or diarrhea that lasts longer than 1 week. DIAGNOSIS People often do not know they have polyps until their caregiver finds them during a regular checkup. Your caregiver can use 4 tests to check for polyps:  Digital rectal exam. The caregiver wears gloves and feels inside the rectum. This test would find polyps only in the rectum.  Barium enema. The caregiver puts a liquid called barium into your rectum before taking X-rays of your colon. Barium makes your colon look white. Polyps are dark, so they are easy to see in the X-ray pictures.  Sigmoidoscopy. A thin, flexible tube (sigmoidoscope) is placed into your rectum. The sigmoidoscope has a light and tiny camera in it. The caregiver uses the sigmoidoscope to look at the last third of your colon.  Colonoscopy. This test is like sigmoidoscopy, but the caregiver looks at the entire colon. This is the most common method for finding and removing polyps. TREATMENT  Any polyps will be removed during a sigmoidoscopy or colonoscopy. The polyps are then  tested for cancer. PREVENTION  To help lower your risk of getting more colon polyps:  Eat plenty of fruits and vegetables. Avoid eating fatty foods.  Do not smoke.  Avoid drinking alcohol.  Exercise every day.  Lose weight if recommended by your caregiver.  Eat plenty of calcium and folate. Foods that are rich in calcium include milk, cheese, and broccoli. Foods that are rich in folate include chickpeas, kidney beans, and spinach. HOME CARE INSTRUCTIONS Keep all follow-up appointments as directed by your caregiver. You may need periodic exams to check for polyps. SEEK MEDICAL CARE IF: You notice bleeding during a bowel movement.

## 2013-05-11 NOTE — Interval H&P Note (Signed)
History and Physical Interval Note:  05/11/2013 11:01 AM  Kristin Taylor  has presented today for surgery, with the diagnosis of CHRONIC GERD, ABNORMAL COLON CT CONSTIPATION  The various methods of treatment have been discussed with the patient and family. After consideration of risks, benefits and other options for treatment, the patient has consented to  Procedure(s) with comments: COLONOSCOPY WITH ESOPHAGOGASTRODUODENOSCOPY (EGD) (N/A) - 1045 as a surgical intervention .  The patient's history has been reviewed, patient examined, no change in status, stable for surgery.  I have reviewed the patient's chart and labs.  Questions were answered to the patient's satisfaction.     GERD better with  regular Nexium use.  EGD and colonoscopy per plan. The risks, benefits, limitations, imponderables and alternatives regarding both EGD and colonoscopy have been reviewed with the patient. Questions have been answered. All parties agreeable.   Manus Rudd

## 2013-05-11 NOTE — H&P (View-Only) (Signed)
Primary Care Physician:  Glo Herring., MD  Primary Gastroenterologist:  Garfield Cornea, MD   Chief Complaint  Patient presents with  . Diverticulitis    HPI:  Kristin Taylor is a 68 y.o. female here for further evaluation of colitis. We last saw patient at time of high risk screening colonoscopy in 2007. Her mother had colon cancer in her late 60s/early 14s. Patient had two cecal diverticula but otherwise normal. In 2009, she presented with left sided abdominal pain, rectal bleeding. 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 Watt Climes. She was noted to have marked narrowing at 60cm and exam stopped due to patient discomfort, increasing inflammation, and lumen narrowing.   Patient developed acute onset left sided abd pain (upper abd), diarrhea, blood in stool which led to ER visit on 03/11/13. CT showed mild wall thickening involving 10cm segment of descending colon with subtle adjacent inflammatory change. She was given seven day course of Cipro/Flagyl by her PCP. No further abdominal pain. Has some burning with spicy foods, fruit. Burning left abdomen. No further rectal bleeding. Prior to getting sick, BM twice per week. At this point BM 3-4 times per week with occasional loose stool. Chronically constipation. No melena. No fever. No ill contacts. No bad food, travel abroad, or antibiotics prior this. Since this illness, more indigestion. Taking the Nexium after breakfast, every other day only. Switching to generic nexium with next bottle. No dysphagia, no vomiting. On PPI since 2001. No ASA powders. No frequent NSAIDs.   Current Outpatient Prescriptions  Medication Sig Dispense Refill  . aspirin EC 81 MG tablet Take 81 mg by mouth daily.      Marland Kitchen atorvastatin (LIPITOR) 10 MG tablet Take 10 mg by mouth daily.      Marland Kitchen BLACK COHOSH PO Take by mouth.      . calcium carbonate (OS-CAL) 600 MG TABS tablet Take 600 mg by mouth 2 (two) times daily with a meal.      . esomeprazole (NEXIUM) 40 MG capsule Take  40 mg by mouth. One every other day      . ezetimibe (ZETIA) 10 MG tablet Take 10 mg by mouth daily.      . fish oil-omega-3 fatty acids 1000 MG capsule Take 1 g by mouth 4 (four) times daily.      . metoprolol (LOPRESSOR) 50 MG tablet Take 50 mg by mouth. 1.5 tablet daily      . Multiple Vitamin (MULTIVITAMIN WITH MINERALS) TABS Take 1 tablet by mouth daily.      . vitamin C (ASCORBIC ACID) 500 MG tablet Take 500 mg by mouth daily.       No current facility-administered medications for this visit.    Allergies as of 04/28/2013  . (No Known Allergies)    Past Medical History  Diagnosis Date  . GERD (gastroesophageal reflux disease)   . Hypertension   . Shortness of breath     on exertion  . Glaucoma     Past Surgical History  Procedure Laterality Date  . Coronary artery bypass graft  2001  . Cardiac catheterization  2001  . Abdominal hysterectomy  1998  . Anterior and posterior repair    . Cystocele repair N/A 06/22/2012    Procedure: ANTERIOR REPAIR (CYSTOCELE);  Surgeon: Gus Height, MD;  Location: [facility];  Service: Gynecology;  Laterality: N/A;  . Colonoscopy  07/22/2005    RMR:Two cecal diverticula.  Remainder of the colonic mucosa appeared normal, normal rectum.  Marland Kitchen  Flexible sigmoidoscopy  2009    Dr. Watt Climes: ischemic colitis    Family History  Problem Relation Age of Onset  . Colon cancer Mother     age late 60s/early 92s.    History   Social History  . Marital Status: Divorced    Spouse Name: N/A    Number of Children: 2  . Years of Education: N/A   Occupational History  . PT at church    Social History Main Topics  . Smoking status: Former Smoker    Types: Cigarettes    Quit date: 03/11/1999  . Smokeless tobacco: Not on file  . Alcohol Use: 0.6 oz/week    1 Glasses of wine per week     Comment: rarely  . Drug Use: No  . Sexual Activity: Not on file   Other Topics Concern  . Not on file   Social History Narrative  . No narrative on file       ROS:  General: Negative for anorexia, weight loss, fever, chills, fatigue, weakness. Eyes: Negative for vision changes.  ENT: Negative for hoarseness, difficulty swallowing , nasal congestion. CV: Negative for chest pain, angina, palpitations, dyspnea on exertion, peripheral edema.  Respiratory: Negative for dyspnea at rest, dyspnea on exertion, cough, sputum, wheezing.  GI: See history of present illness. GU:  Negative for dysuria, hematuria, urinary incontinence, urinary frequency, nocturnal urination.  MS: Negative for joint pain, low back pain.  Derm: Negative for rash or itching.  Neuro: Negative for weakness, abnormal sensation, seizure, frequent headaches, memory loss, confusion.  Psych: Negative for anxiety, depression, suicidal ideation, hallucinations.  Endo: Negative for unusual weight change.  Heme: Negative for bruising or bleeding. Allergy: Negative for rash or hives.    Physical Examination:  BP 140/78  Pulse 49  Temp(Src) 97.6 F (36.4 C) (Oral)  Ht 5\' 7"  (1.702 m)  Wt 160 lb 3.2 oz (72.666 kg)  BMI 25.08 kg/m2   General: Well-nourished, well-developed in no acute distress.  Head: Normocephalic, atraumatic.   Eyes: Conjunctiva pink, no icterus. Mouth: Oropharyngeal mucosa moist and pink , no lesions erythema or exudate. Neck: Supple without thyromegaly, masses, or lymphadenopathy.  Lungs: Clear to auscultation bilaterally.  Heart: Regular rate and rhythm, no murmurs rubs or gallops.  Abdomen: Bowel sounds are normal, nontender, nondistended, no hepatosplenomegaly or masses, no abdominal bruits or    hernia , no rebound or guarding.   Rectal: not performed Extremities: No lower extremity edema. No clubbing or deformities.  Neuro: Alert and oriented x 4 , grossly normal neurologically.  Skin: Warm and dry, no rash or jaundice.   Psych: Alert and cooperative, normal mood and affect.  Labs: Lab Results  Component Value Date   ALT 18 03/10/2013   AST 18  03/10/2013   ALKPHOS 91 03/10/2013   BILITOT 0.3 03/10/2013   Lab Results  Component Value Date   CREATININE 0.55 03/11/2013   BUN 16 03/11/2013   NA 141 03/11/2013   K 4.5 03/11/2013   CL 106 03/11/2013   CO2 26 03/11/2013   Lab Results  Component Value Date   WBC 10.9* 03/11/2013   HGB 13.1 03/11/2013   HCT 38.8 03/11/2013   MCV 90.4 03/11/2013   PLT 272 03/11/2013     Imaging Studies: CT abdomen pelvis with contrast on 03/11/2013 Impression: Mild wall thickening involving a 10 cm segment of the descending colon with only subtle adjacent inflammatory changes findings are likely due to a mild acute colitis. Minimal diverticulosis  of the colon. Mild cardiomegaly. Mild atherosclerotic disease of the abdominal aorta and iliac vessels.

## 2013-05-11 NOTE — Op Note (Signed)
[facility] [address] [place], [address]   ENDOSCOPY PROCEDURE REPORT  PATIENT: Kristin Taylor, Kristin Taylor  MR#: [id number] BIRTHDATE: 02/17/1946 , 49  yrs. old GENDER: Female ENDOSCOPIST: R.  Garfield Cornea, MD FACP FACG REFERRED BY:  Kerin Perna, M.D. PROCEDURE DATE:  05/11/2013 PROCEDURE:     Diagnostic EGD  INDICATIONS:     long-standing GERD  INFORMED CONSENT:   The risks, benefits, limitations, alternatives and imponderables have been discussed.  The potential for biopsy, esophogeal dilation, etc. have also been reviewed.  Questions have been answered.  All parties agreeable.  Please see the history and physical in the medical record for more information.  MEDICATIONS:    Versed 5 mg IV and Demerol 75 mg IV in divided doses. Zofran 4 mg IV. Xylocaine gel for topical oropharyngeal anesthesia  DESCRIPTION OF PROCEDURE:   The [id number] ([id number])  endoscope was introduced through the mouth and advanced to the second portion of the duodenum without difficulty or limitations.  The mucosal surfaces were surveyed very carefully during advancement of the scope and upon withdrawal.  Retroflexion view of the proximal stomach and esophagogastric junction was performed.      FINDINGS:   Normal appearing patent tubular esophagus. Stomach empty. Multiple small hyperplastic appearing polyps - the largest being approximately 5 mm in dimensions. Small hiatal hernia. Otherwise, the remainder the gastric mucosa appeared normal. Patent pylorus. Normal first and second portion of the duodenum  THERAPEUTIC / DIAGNOSTIC MANEUVERS PERFORMED:  The largest polyp in the stomach was cold biopsied   COMPLICATIONS:  None  IMPRESSION:  Small hiatal hernia;  gastric polyps-status post biopsy  RECOMMENDATIONS:   Continue Nexium 40 mg daily for GERD.   Followup on pathology. See colonoscopy report.   _______________________________ R. Garfield Cornea, MD FACP [facility] eSigned:  R. Garfield Cornea, MD FACP [facility] 05/11/2013 11:21 AM     CC:

## 2013-05-13 ENCOUNTER — Encounter: Payer: Self-pay | Admitting: Internal Medicine

## 2013-05-16 ENCOUNTER — Encounter (HOSPITAL_COMMUNITY): Payer: Self-pay | Admitting: Internal Medicine

## 2013-05-16 ENCOUNTER — Ambulatory Visit: Payer: Medicare Other | Admitting: Cardiovascular Disease

## 2013-06-07 ENCOUNTER — Ambulatory Visit: Payer: Medicare HMO | Admitting: Cardiovascular Disease

## 2013-06-09 ENCOUNTER — Encounter: Payer: Self-pay | Admitting: *Deleted

## 2013-06-10 ENCOUNTER — Ambulatory Visit (INDEPENDENT_AMBULATORY_CARE_PROVIDER_SITE_OTHER): Payer: Medicare HMO | Admitting: Cardiovascular Disease

## 2013-06-10 ENCOUNTER — Other Ambulatory Visit: Payer: Self-pay | Admitting: Cardiovascular Disease

## 2013-06-10 ENCOUNTER — Encounter: Payer: Self-pay | Admitting: Cardiovascular Disease

## 2013-06-10 VITALS — BP 118/70 | HR 58 | Ht 66.0 in | Wt 159.6 lb

## 2013-06-10 DIAGNOSIS — R5383 Other fatigue: Secondary | ICD-10-CM

## 2013-06-10 DIAGNOSIS — Z87448 Personal history of other diseases of urinary system: Secondary | ICD-10-CM

## 2013-06-10 DIAGNOSIS — R5381 Other malaise: Secondary | ICD-10-CM

## 2013-06-10 DIAGNOSIS — E782 Mixed hyperlipidemia: Secondary | ICD-10-CM

## 2013-06-10 DIAGNOSIS — Z79899 Other long term (current) drug therapy: Secondary | ICD-10-CM

## 2013-06-10 DIAGNOSIS — E785 Hyperlipidemia, unspecified: Secondary | ICD-10-CM

## 2013-06-10 DIAGNOSIS — I2510 Atherosclerotic heart disease of native coronary artery without angina pectoris: Secondary | ICD-10-CM

## 2013-06-10 NOTE — Telephone Encounter (Signed)
Rx refill sent to patients pharmacy  

## 2013-06-10 NOTE — Addendum Note (Signed)
Addended by: Raiford Simmonds on: 06/10/2013 09:36 AM   Modules accepted: Orders

## 2013-06-10 NOTE — Patient Instructions (Signed)
Labs CBC,CMP, LIPIDS ,TSH  Your physician wants you to follow-up in [place].  You will receive a reminder letter in the mail two months in advance. If you don't receive a letter, please call our office to schedule the follow-up appointment.

## 2013-06-10 NOTE — Progress Notes (Signed)
Patient ID: NICKCOLE BRALLEY, female   DOB: 09/30/1945, 68 y.o.   MRN: [id number]     HPI: KORAL THADEN is a 68 y.o. female who presents for one-year cardiology evaluation.  Ms. Ringel is a very pleasant 68 year old African American female who underwent CABG revascularization surgery in November 2001 after cardiac catheterization demonstrated 90% ostial left main stenosis. CABG surgery was done by Dr. Roxan Hockey and she had a LIMA graft placed to the LAD, and a RIMA graft placed to a ramus intermediate vessel. She does have a history of hyperlipidemia. At times she has noted some intermittent palpitations which have been controlled with beta blocker therapy. She has undergone prior hysterectomy and last year underwent surgery for a prolapsed bladder and tolerated this well from a cardiovascular standpoint.  Her last nuclear perfusion study was in October 2012 which showed normal perfusion and function. Her last echo Doppler study was done in December 2013 which showed aortic valve sclerosis without stenosis, mild mitral regurgitation, moderate tricuspid regurgitation, and mild pulmonary hypertension with PA pressure 35 mm. Normal systolic function.  Over the past year, Ms. Nisley has continued to be stable. She exercises at least 5 days per week. She denies any anginal symptoms. She is unaware of palpitations. She denies PND or orthopnea.  Past Medical History  Diagnosis Date  . GERD (gastroesophageal reflux disease)   . Hypertension   . Shortness of breath     on exertion  . Glaucoma   . S/P CABG x 2 2001  . CAD (coronary artery disease)   . Female bladder prolapse   . Hyperlipidemia     Past Surgical History  Procedure Laterality Date  . Coronary artery bypass graft  01/14/2000    LIMA-LAD, RIMA-ramus intermedius (Dr. Chauncey Cruel. Hendrickson)  . Cardiac catheterization  01/13/2000    90% eccentric ostial L main stenosis, LAD normal; Cfx normal; RCA normal (Dr. Corky Downs)   . Abdominal hysterectomy  1998    . Anterior and posterior repair    . Cystocele repair N/A 06/22/2012    Procedure: ANTERIOR REPAIR (CYSTOCELE);  Surgeon: Gus Height, MD;  Location: [facility];  Service: Gynecology;  Laterality: N/A;  . Colonoscopy  07/22/2005    RMR:Two cecal diverticula.  Remainder of the colonic mucosa appeared normal, normal rectum.  . Flexible sigmoidoscopy  2009    Dr. Watt Climes: ischemic colitis  . Colonoscopy with esophagogastroduodenoscopy (egd) N/A 05/11/2013    Procedure: COLONOSCOPY WITH ESOPHAGOGASTRODUODENOSCOPY (EGD);  Surgeon: Daneil Dolin, MD;  [facility]: [facility];  Service: Endoscopy;  Laterality: N/A;  1045  . Transthoracic echocardiogram  02/2012    EF 55-60%; AB sclerosis; mild MR; mod TR; PA peak pressure 37mmHg; normal CVP  . Nm myocar perf wall motion  12/2010    bruce myoview - normal perfusion, low risk    No Known Allergies  Current Outpatient Prescriptions  Medication Sig Dispense Refill  . aspirin EC 81 MG tablet Take 81 mg by mouth daily.      Marland Kitchen atorvastatin (LIPITOR) 10 MG tablet Take 10 mg by mouth daily.      Marland Kitchen BLACK COHOSH PO Take 1 tablet by mouth 2 (two) times daily.       . Calcium Carbonate (CALTRATE 600 PO) Take 1 tablet by mouth daily.      Marland Kitchen ezetimibe (ZETIA) 10 MG tablet Take 1 tablet (10 mg total) by mouth daily.  15 tablet  0  . fish oil-omega-3 fatty acids 1000 MG capsule  Take 1 g by mouth 4 (four) times daily.      Marland Kitchen lubiprostone (AMITIZA) 24 MCG capsule Take 24 mcg by mouth 2 (two) times daily as needed.      . metoprolol (LOPRESSOR) 50 MG tablet Take 75 mg by mouth daily. 1.5 tablet daily      . Multiple Vitamin (MULTIVITAMIN WITH MINERALS) TABS Take 1 tablet by mouth daily.      . pantoprazole (PROTONIX) 40 MG tablet Take 40 mg by mouth daily.      Vladimir Faster Glycol-Propyl Glycol (SYSTANE) 0.4-0.3 % SOLN Apply 1 drop to eye 2 (two) times daily.      . polyethylene glycol-electrolytes (TRILYTE) 420 G solution Take 4,000 mLs by mouth as directed.  4000 mL  0   . vitamin C (ASCORBIC ACID) 500 MG tablet Take 500 mg by mouth daily.       No current facility-administered medications for this visit.    History   Social History  . Marital Status: Divorced    Spouse Name: N/A    Number of Children: 2  . Years of Education: N/A   Occupational History  . PT at church    Social History Main Topics  . Smoking status: Former Smoker    Types: Cigarettes    Quit date: 03/11/1999  . Smokeless tobacco: Not on file  . Alcohol Use: 0.6 oz/week    1 Glasses of wine per week     Comment: rarely  . Drug Use: No  . Sexual Activity: Not on file   Other Topics Concern  . Not on file   Social History Narrative  . No narrative on file   Socially she is single. She has 2 children. She is retired from [employer]. She does work her time church. There is no tobacco use. She exercises daily.  Family History  Problem Relation Age of Onset  . Colon cancer Mother     age late 60s/early 62s.  . Cancer Father   . Cancer Maternal Grandmother   . Alzheimer's disease Maternal Grandfather     ROS is negative for fevers, chills or night sweats.  She denies rash. She denies weight gain and in fact with her exercise admits to purposeful weight loss. She has lost 6 pounds since last year. She denies visual changes. There are no changes in hearing. There is no wheezing. She denies PND or orthopnea. She denies palpitations. She is unaware of presyncope or syncope. There is no chest pain. She denies nausea vomiting or diarrhea. She is status post surgery for prolapsed bladder. She did have rectal bleeding in January leading to an ER evaluation. She was told of having possible colitis. She had a prior episode 5 years previously There is no claudication. She denies tremors. She denies difficulty with sleep. There is no diabetes. She denies cold or heat intolerance for Other comprehensive 14 point system review is negative.  PE BP 118/70  Pulse 58  Ht 5\' 6"  (1.676 m)   Wt 159 lb 9.6 oz (72.394 kg)  BMI 25.77 kg/m2  General: Alert, oriented, no distress.  Skin: normal turgor, no rashes HEENT: Normocephalic, atraumatic. Pupils round and reactive; sclera anicteric;no lid lag. Extraocular muscles intact;; no xanthelasmas. Nose without nasal septal hypertrophy Mouth/Parynx benign; Mallinpatti scale 2 Neck: No JVD, no carotid bruits; normal carotid upstroke Lungs: clear to ausculatation and percussion; no wheezing or rales Chest wall: no tenderness to palpitation Heart: RRR, s1 s2 normal; 0-1/7 systolic murmur in  the aortic region. no diastolic murmur, rub thrills or heaves Abdomen: soft, nontender; no hepatosplenomehaly, BS+; abdominal aorta nontender and not dilated by palpation. Back: no CVA tenderness Pulses 2+ Extremities: no clubbing cyanosis or edema, Homan's sign negative  Neurologic: grossly nonfocal; cranial nerves grossly normal. Psychologic: normal affect and mood.  ECG (independently read by me): Sinus bradycardia 58 beats per minute. No ectopy. Normal intervals.  LABS:  BMET    Component Value Date/Time   NA 141 03/11/2013 0405   K 4.5 03/11/2013 0405   CL 106 03/11/2013 0405   CO2 26 03/11/2013 0405   GLUCOSE 102* 03/11/2013 0405   BUN 16 03/11/2013 0405   CREATININE 0.55 03/11/2013 0405   CALCIUM 10.0 03/11/2013 0405   GFRNONAA >90 03/11/2013 0405   GFRAA >90 03/11/2013 0405     Hepatic Function Panel     Component Value Date/Time   PROT 7.9 03/10/2013 2342   ALBUMIN 3.9 03/10/2013 2342   AST 18 03/10/2013 2342   ALT 18 03/10/2013 2342   ALKPHOS 91 03/10/2013 2342   BILITOT 0.3 03/10/2013 2342     CBC    Component Value Date/Time   WBC 10.9* 03/11/2013 0405   RBC 4.29 03/11/2013 0405   HGB 13.1 03/11/2013 0405   HCT 38.8 03/11/2013 0405   PLT 272 03/11/2013 0405   MCV 90.4 03/11/2013 0405   MCH 30.5 03/11/2013 0405   MCHC 33.8 03/11/2013 0405   RDW 13.3 03/11/2013 0405   LYMPHSABS 2.2 03/11/2013 0405   MONOABS 1.0 03/11/2013 0405   EOSABS 0.1 03/11/2013 0405    BASOSABS 0.1 03/11/2013 0405     BNP No results found for this basename: probnp    Lipid Panel  No results found for this basename: chol, trig, hdl, cholhdl, vldl, ldlcalc     RADIOLOGY: No results found.    ASSESSMENT AND PLAN:  Ms. Harriette Ohara is a very pleasant 68 year old African American female who is now 13-1/2 years status post CABG revascularization surgery for high-grade ostial left main stenosis. She has bilateral arterial conduits placed to her LAD and ramus intermediate vessel. Left perfusion study in October 2012 showed normal perfusion. She does have a cardiac murmur most likely due to her aortic sclerosis, MR and TR. Presently, she is doing well. She is unaware of palpitations. I am recommending repeat laboratory be drawn. She tells me she did have episodes with rectal bleeding in January leading to emergency room evaluation and subsequently saw Dr. Buford Dresser for this. She was told of having colitis possibly at that time. She has not had her cholesterol checked in over a year. I encouraged her to continue her exercise. She is taking Protonix 40 mg. She is on atorvastatin 10 mg and Zetia 10 mg for her hyperlipidemia. She's not having any ectopy on her current dose of metoprolol. I will see her in one year for cardiology followup evaluation or sooner if problems arise.    Troy Sine, MD, [facility]  06/10/2013 9:19 AM

## 2013-06-13 ENCOUNTER — Other Ambulatory Visit: Payer: Self-pay

## 2013-06-13 ENCOUNTER — Other Ambulatory Visit: Payer: Self-pay | Admitting: *Deleted

## 2013-06-13 LAB — COMPREHENSIVE METABOLIC PANEL
ALT: 15 U/L (ref 0–35)
AST: 17 U/L (ref 0–37)
Albumin: 4.3 g/dL (ref 3.5–5.2)
Alkaline Phosphatase: 78 U/L (ref 39–117)
BILIRUBIN TOTAL: 0.5 mg/dL (ref 0.2–1.2)
BUN: 14 mg/dL (ref 6–23)
CO2: 28 meq/L (ref 19–32)
CREATININE: 0.64 mg/dL (ref 0.50–1.10)
Calcium: 10.7 mg/dL — ABNORMAL HIGH (ref 8.4–10.5)
Chloride: 105 mEq/L (ref 96–112)
GLUCOSE: 91 mg/dL (ref 70–99)
Potassium: 5.1 mEq/L (ref 3.5–5.3)
Sodium: 142 mEq/L (ref 135–145)
Total Protein: 7.1 g/dL (ref 6.0–8.3)

## 2013-06-13 LAB — LIPID PANEL
Cholesterol: 142 mg/dL (ref 0–200)
HDL: 65 mg/dL (ref >39)
LDL Cholesterol: 64 mg/dL (ref 0–99)
TRIGLYCERIDES: 67 mg/dL (ref <150)
Total CHOL/HDL Ratio: 2.2 Ratio
VLDL: 13 mg/dL (ref 0–40)

## 2013-06-13 LAB — CBC
HCT: 40.5 % (ref 36.0–46.0)
Hemoglobin: 13.5 g/dL (ref 12.0–15.0)
MCH: 29.5 pg (ref 26.0–34.0)
MCHC: 33.3 g/dL (ref 30.0–36.0)
MCV: 88.6 fL (ref 78.0–100.0)
PLATELETS: 306 10*3/uL (ref 150–400)
RBC: 4.57 MIL/uL (ref 3.87–5.11)
RDW: 13.2 % (ref 11.5–15.5)
WBC: 5.7 10*3/uL (ref 4.0–10.5)

## 2013-06-13 LAB — TSH: TSH: 1.266 u[IU]/mL (ref 0.350–4.500)

## 2013-06-13 MED ORDER — METOPROLOL SUCCINATE ER 50 MG PO TB24: ORAL_TABLET | ORAL | Status: DC

## 2013-06-13 MED ORDER — METOPROLOL TARTRATE 50 MG PO TABS: 75.0000 mg | ORAL_TABLET | Freq: Every day | ORAL | Status: DC

## 2013-06-13 NOTE — Telephone Encounter (Signed)
Received clarification request from pharmacy for Metoprolol. Lopressor was sent in last, but that is not what the patient takes. Call to patient to clarify whether she was taking the ER or Lopressor. Patient read off her bottle - Metoprolol ER.  Metoprolol ER 50 mg tablets sent into pharmacy electronically. Patient aware.

## 2013-06-13 NOTE — Telephone Encounter (Signed)
Rx refill sent to patients pharmacy  

## 2013-09-14 ENCOUNTER — Other Ambulatory Visit: Payer: Self-pay | Admitting: Obstetrics and Gynecology

## 2013-09-15 LAB — CYTOLOGY - PAP

## 2014-01-23 ENCOUNTER — Encounter: Payer: Self-pay | Admitting: Cardiovascular Disease

## 2014-03-30 ENCOUNTER — Other Ambulatory Visit: Payer: Self-pay | Admitting: Cardiology

## 2014-07-03 ENCOUNTER — Other Ambulatory Visit: Payer: Self-pay | Admitting: Cardiovascular Disease

## 2014-07-03 NOTE — Telephone Encounter (Signed)
Rx(s) sent to pharmacy electronically.  

## 2014-07-05 ENCOUNTER — Other Ambulatory Visit: Payer: Self-pay | Admitting: Cardiovascular Disease

## 2014-07-06 ENCOUNTER — Telehealth: Payer: Self-pay | Admitting: Cardiovascular Disease

## 2014-07-06 NOTE — Telephone Encounter (Signed)
Left message with patient machine the medications were sent in earlier this week and are ready for pick up

## 2014-07-06 NOTE — Telephone Encounter (Signed)
zetia refilled 07/03/14

## 2014-07-06 NOTE — Telephone Encounter (Signed)
Pt called to see if her medicine have been sent in.

## 2014-07-07 ENCOUNTER — Other Ambulatory Visit: Payer: Self-pay | Admitting: Cardiovascular Disease

## 2014-07-07 NOTE — Telephone Encounter (Signed)
Atorvastatin refilled #[phone] on 07/03/14

## 2014-07-31 ENCOUNTER — Telehealth: Payer: Self-pay | Admitting: Cardiovascular Disease

## 2014-07-31 ENCOUNTER — Ambulatory Visit (INDEPENDENT_AMBULATORY_CARE_PROVIDER_SITE_OTHER): Payer: PPO | Admitting: Cardiovascular Disease

## 2014-07-31 ENCOUNTER — Encounter: Payer: Self-pay | Admitting: Cardiovascular Disease

## 2014-07-31 VITALS — BP 120/80 | HR 54 | Ht 66.0 in | Wt 156.2 lb

## 2014-07-31 DIAGNOSIS — R002 Palpitations: Secondary | ICD-10-CM | POA: Diagnosis not present

## 2014-07-31 DIAGNOSIS — I25810 Atherosclerosis of coronary artery bypass graft(s) without angina pectoris: Secondary | ICD-10-CM

## 2014-07-31 DIAGNOSIS — E785 Hyperlipidemia, unspecified: Secondary | ICD-10-CM

## 2014-07-31 LAB — COMPREHENSIVE METABOLIC PANEL
ALBUMIN: 4.1 g/dL (ref 3.5–5.2)
ALK PHOS: 79 U/L (ref 39–117)
ALT: 19 U/L (ref 0–35)
AST: 20 U/L (ref 0–37)
BUN: 15 mg/dL (ref 6–23)
CHLORIDE: 107 meq/L (ref 96–112)
CO2: 29 mEq/L (ref 19–32)
CREATININE: 0.6 mg/dL (ref 0.50–1.10)
Calcium: 10.7 mg/dL — ABNORMAL HIGH (ref 8.4–10.5)
GLUCOSE: 96 mg/dL (ref 70–99)
Potassium: 4.7 mEq/L (ref 3.5–5.3)
SODIUM: 138 meq/L (ref 135–145)
Total Bilirubin: 0.6 mg/dL (ref 0.2–1.2)
Total Protein: 7.5 g/dL (ref 6.0–8.3)

## 2014-07-31 LAB — CBC
HCT: 42.3 % (ref 36.0–46.0)
HEMOGLOBIN: 14 g/dL (ref 12.0–15.0)
MCH: 29.8 pg (ref 26.0–34.0)
MCHC: 33.1 g/dL (ref 30.0–36.0)
MCV: 90 fL (ref 78.0–100.0)
MPV: 11.6 fL (ref 8.6–12.4)
Platelets: 300 10*3/uL (ref 150–400)
RBC: 4.7 MIL/uL (ref 3.87–5.11)
RDW: 12.9 % (ref 11.5–15.5)
WBC: 7.2 10*3/uL (ref 4.0–10.5)

## 2014-07-31 LAB — LIPID PANEL
Cholesterol: 145 mg/dL (ref 0–200)
HDL: 73 mg/dL (ref >=46)
LDL Cholesterol: 56 mg/dL (ref 0–99)
TRIGLYCERIDES: 82 mg/dL (ref <150)
Total CHOL/HDL Ratio: 2 Ratio
VLDL: 16 mg/dL (ref 0–40)

## 2014-07-31 LAB — TSH: TSH: 1.304 u[IU]/mL (ref 0.350–4.500)

## 2014-07-31 NOTE — Telephone Encounter (Signed)
Pt was here earlier to see Dr Claiborne Billings. She saw her diagnosis on her paper she received,she wants to know if it is all right for her to continue exercising? Are there anythings she should not do?

## 2014-07-31 NOTE — Progress Notes (Signed)
Patient ID: Kristin Taylor, female   DOB: 1945/06/19, 69 y.o.   MRN: [id number]   Primary M.D.: Dr. Hilma Favors  HPI: Kristin Taylor is a 69 y.o. female who presents for one-year cardiology evaluation.  Kristin Taylor underwent CABG revascularization surgery in November 2001 after cardiac catheterization demonstrated 90% ostial left main stenosis. CABG surgery was done by Dr. Roxan Hockey and she had a LIMA graft placed to the LAD, and a RIMA graft placed to a ramus intermediate vessel. She does have a history of hyperlipidemia. At times she has noted some intermittent palpitations which have been controlled with beta blocker therapy. She has undergone prior hysterectomy and 2 years ago year underwent surgery for a prolapsed bladder and tolerated this well from a cardiovascular standpoint.  Her last nuclear perfusion study was in October 2012 which showed normal perfusion and function. Her last echo Doppler study was done in December 2013 which showed aortic valve sclerosis without stenosis, mild mitral regurgitation, moderate tricuspid regurgitation, and mild pulmonary hypertension with PA pressure 35 mm. Normal systolic function.  Over the past year, Kristin Taylor has continued to be stable. She exercises regularly. She denies any anginal symptoms. She denies PND or orthopnea.  She has been on Toprol-XL 75 mg daily for her palpitations.  She notes occasional 2.  At times, frequent palpitations shortly after going to bed.  She denies associated chest pain or shortness of breath.  She denies sleep issues.  She has been on atorvastatin 10 mg Zetia 10 mg and fish oil for hyperlipidemia.  She also has GERD for which he takes Protonix.  She is on aspirin.  She has not had recent laboratory year.  Past Medical History  Diagnosis Date  . GERD (gastroesophageal reflux disease)   . Hypertension   . Shortness of breath     on exertion  . Glaucoma   . S/P CABG x 2 2001  . CAD (coronary artery disease)   . Female bladder  prolapse   . Hyperlipidemia     Past Surgical History  Procedure Laterality Date  . Coronary artery bypass graft  01/14/2000    LIMA-LAD, RIMA-ramus intermedius (Dr. Chauncey Cruel. Hendrickson)  . Cardiac catheterization  01/13/2000    90% eccentric ostial L main stenosis, LAD normal; Cfx normal; RCA normal (Dr. Corky Downs)   . Abdominal hysterectomy  1998  . Anterior and posterior repair    . Cystocele repair N/A 06/22/2012    Procedure: ANTERIOR REPAIR (CYSTOCELE);  Surgeon: Gus Height, MD;  Location: [facility];  Service: Gynecology;  Laterality: N/A;  . Colonoscopy  07/22/2005    RMR:Two cecal diverticula.  Remainder of the colonic mucosa appeared normal, normal rectum.  . Flexible sigmoidoscopy  2009    Dr. Watt Climes: ischemic colitis  . Colonoscopy with esophagogastroduodenoscopy (egd) N/A 05/11/2013    Procedure: COLONOSCOPY WITH ESOPHAGOGASTRODUODENOSCOPY (EGD);  Surgeon: Daneil Dolin, MD;  Location: [facility];  Service: Endoscopy;  Laterality: N/A;  1045  . Transthoracic echocardiogram  02/2012    EF 55-60%; AB sclerosis; mild MR; mod TR; PA peak pressure 41mmHg; normal CVP  . Nm myocar perf wall motion  12/2010    bruce myoview - normal perfusion, low risk    No Known Allergies  Current Outpatient Prescriptions  Medication Sig Dispense Refill  . aspirin EC 81 MG tablet Take 81 mg by mouth daily.    Marland Kitchen atorvastatin (LIPITOR) 10 MG tablet Take 1 tablet (10 mg total) by mouth daily. <PLEASE MAKE APPOINTMENT  FOR REFILLS> 90 tablet 0  . BLACK COHOSH PO Take 1 tablet by mouth 2 (two) times daily.     . Calcium Carbonate (CALTRATE 600 PO) Take 1 tablet by mouth daily.    Marland Kitchen ezetimibe (ZETIA) 10 MG tablet Take 1 tablet (10 mg total) by mouth daily. <PLEASE MAKE APPOINTMENT FOR REFILLS> 90 tablet 0  . fish oil-omega-3 fatty acids 1000 MG capsule Take 1 g by mouth 4 (four) times daily.    Marland Kitchen lubiprostone (AMITIZA) 24 MCG capsule Take 24 mcg by mouth 2 (two) times daily as needed.    . metoprolol  succinate (TOPROL-XL) 50 MG 24 hr tablet TAKE 1.5 TABLETS (75MG ) BY MOUTH DAILY. <PLEASE MAKE APPOINTMENT FOR REFILLS> 45 tablet 0  . Multiple Vitamin (MULTIVITAMIN WITH MINERALS) TABS Take 1 tablet by mouth daily.    . pantoprazole (PROTONIX) 40 MG tablet Take 40 mg by mouth daily.    . pantoprazole (PROTONIX) 40 MG tablet TAKE ONE TABLET BY MOUTH ONCE DAILY 30 tablet 5  . Polyethyl Glycol-Propyl Glycol (SYSTANE) 0.4-0.3 % SOLN Apply 1 drop to eye 2 (two) times daily.    . vitamin C (ASCORBIC ACID) 500 MG tablet Take 500 mg by mouth daily.     No current facility-administered medications for this visit.    History   Social History  . Marital Status: Divorced    Spouse Name: N/A  . Number of Children: 2  . Years of Education: N/A   Occupational History  . PT at church    Social History Main Topics  . Smoking status: Former Smoker    Types: Cigarettes    Quit date: 03/11/1999  . Smokeless tobacco: Not on file  . Alcohol Use: 0.6 oz/week    1 Glasses of wine per week     Comment: rarely  . Drug Use: No  . Sexual Activity: Not on file   Other Topics Concern  . Not on file   Social History Narrative   Socially she is single. She has 2 children. She is retired from [employer]. She does work her time church. There is no tobacco use. She exercises daily.  Family History  Problem Relation Age of Onset  . Colon cancer Mother     age late 60s/early 6s.  . Cancer Father   . Cancer Maternal Grandmother   . Alzheimer's disease Maternal Grandfather    ROS General: Negative; No fevers, chills, or night sweats;  HEENT: Negative; No changes in vision or hearing, sinus congestion, difficulty swallowing Pulmonary: Negative; No cough, wheezing, shortness of breath, hemoptysis Cardiovascular: See history of present illness GI: Negative; No nausea, vomiting, diarrhea, or abdominal pain GU: Positive for GERD, controlled No dysuria, hematuria, or difficulty voiding Musculoskeletal:  Negative; no myalgias, joint pain, or weakness Hematologic/Oncology: Negative; no easy bruising, bleeding Endocrine: Negative; no heat/cold intolerance; no diabetes Neuro: Negative; no changes in balance, headaches Skin: Negative; No rashes or skin lesions Psychiatric: Negative; No behavioral problems, depression Sleep: Negative; No snoring, daytime sleepiness, hypersomnolence, bruxism, restless legs, hypnogognic hallucinations, no cataplexy Other comprehensive 14 point system review is negative.  PE BP 120/80 mmHg  Pulse 54  Ht 5\' 6"  (1.676 m)  Wt 156 lb 3.2 oz (70.852 kg)  BMI 25.22 kg/m2  Wt Readings from Last 3 Encounters:  07/31/14 156 lb 3.2 oz (70.852 kg)  06/10/13 159 lb 9.6 oz (72.394 kg)  05/11/13 160 lb (72.576 kg)   General: Alert, oriented, no distress.  Skin: normal turgor, no  rashes HEENT: Normocephalic, atraumatic. Pupils round and reactive; sclera anicteric;no lid lag. Extraocular muscles intact;; no xanthelasmas. Nose without nasal septal hypertrophy Mouth/Parynx benign; Mallinpatti scale 2 Neck: No JVD, no carotid bruits; normal carotid upstroke Lungs: clear to ausculatation and percussion; no wheezing or rales Chest wall: no tenderness to palpitation Heart: Regular rhythm with occasional ectopy, sometimes occurring every third or fourth beat, isolated, s1 s2 normal; 6-1/4 systolic murmur in the aortic region. no diastolic murmur, rub thrills or heaves Abdomen: soft, nontender; no hepatosplenomehaly, BS+; abdominal aorta nontender and not dilated by palpation. Back: no CVA tenderness Pulses 2+ Extremities: no clubbing cyanosis or edema, Homan's sign negative  Neurologic: grossly nonfocal; cranial nerves grossly normal. Psychologic: normal affect and mood.  ECG (independently read by me): Sinus bradycardia at 54.  PAC.  April 2015 ECG (independently read by me): Sinus bradycardia 58 beats per minute. No ectopy. Normal intervals.  LABS:  BMET    Component  Value Date/Time   NA 142 06/13/2013 0813   K 5.1 06/13/2013 0813   CL 105 06/13/2013 0813   CO2 28 06/13/2013 0813   GLUCOSE 91 06/13/2013 0813   BUN 14 06/13/2013 0813   CREATININE 0.64 06/13/2013 0813   CREATININE 0.55 03/11/2013 0405   CALCIUM 10.7* 06/13/2013 0813   GFRNONAA >90 03/11/2013 0405   GFRAA >90 03/11/2013 0405     Hepatic Function Panel     Component Value Date/Time   PROT 7.1 06/13/2013 0813   ALBUMIN 4.3 06/13/2013 0813   AST 17 06/13/2013 0813   ALT 15 06/13/2013 0813   ALKPHOS 78 06/13/2013 0813   BILITOT 0.5 06/13/2013 0813     CBC    Component Value Date/Time   WBC 5.7 06/13/2013 0813   RBC 4.57 06/13/2013 0813   HGB 13.5 06/13/2013 0813   HCT 40.5 06/13/2013 0813   PLT 306 06/13/2013 0813   MCV 88.6 06/13/2013 0813   MCH 29.5 06/13/2013 0813   MCHC 33.3 06/13/2013 0813   RDW 13.2 06/13/2013 0813   LYMPHSABS 2.2 03/11/2013 0405   MONOABS 1.0 03/11/2013 0405   EOSABS 0.1 03/11/2013 0405   BASOSABS 0.1 03/11/2013 0405     BNP No results found for: PROBNP    Lipid Panel     Component Value Date/Time   CHOL 142 06/13/2013 0813   TRIG 67 06/13/2013 0813   HDL 65 06/13/2013 0813   CHOLHDL 2.2 06/13/2013 0813   VLDL 13 06/13/2013 0813   LDLCALC 64 06/13/2013 0813     RADIOLOGY: No results found.    ASSESSMENT AND PLAN:  Kristin Taylor is a very pleasant 70 year old African American female who is 14-1/2 years status post CABG revascularization surgery for high-grade ostial left main stenosis. She has bilateral arterial conduits placed to her LAD and ramus intermediate vessel.  Her last nuclear perfusion study in October 2012 showed normal perfusion. She does have a cardiac murmur most likely due to her aortic sclerosis, MR and TR. Presently, she is doing well.  She has noticed occasional palpitations.  On auscultation today.  She was noted to have occasional isolated ectopic complex.  On the ECG.  PAC was documented.  She has been taking  Toprol-XL 75 mg in the morning and feels well.  I have suggested she try taking A 25 mg at night, but she will she develops any episodes of dizziness or if her heart rate drops below 50.  She continues to be active and exercises readily without chest pain or  shortness of breath.  Her last echo Doppler study was in December 2013.  In 6 months, I am recommending she undergo a three-year follow-up echo evaluation to reassess systolic and diastolic function as well as her aortic valve sclerosis.  I am recommending a complete set of laboratory be obtained in the fasting state.  I will see her in 6 months for reevaluation.Troy Sine, MD, [facility] ([place]  07/31/2014 8:23 AM

## 2014-07-31 NOTE — Patient Instructions (Signed)
Your physician recommends that you return for lab work fasting.  Your physician has requested that you have an echocardiogram. Echocardiography is a painless test that uses sound waves to create images of your heart. It provides your doctor with information about the size and shape of your heart and how well your heart's chambers and valves are working. This procedure takes approximately one hour. There are no restrictions for this procedure. This will be done in 6 months.   Your physician wants you to follow-up in: 6 months or sooner if needed. You will receive a reminder letter in the mail two months in advance. If you don't receive a letter, please call our office to schedule the follow-up appointment.

## 2014-07-31 NOTE — Telephone Encounter (Signed)
Called patient, noted historical diagnoses. SHe wanted to make sure nothing changed, no recommendations for exercise restrictions.  Advised that Dr. Claiborne Billings noted she stays active and exercises regularly, made no recommendation to change this. She voiced understanding.

## 2014-08-02 ENCOUNTER — Telehealth: Payer: Self-pay | Admitting: Cardiovascular Disease

## 2014-08-02 NOTE — Telephone Encounter (Signed)
Can try protonix bid for the short term. Nexium worked well, but her insurance no longer covered it.

## 2014-08-02 NOTE — Telephone Encounter (Signed)
Advised pt to start protonix BID - if no better in 1 week or so, can switch back over to Nexium - she voiced understanding w/ instructions.

## 2014-08-02 NOTE — Telephone Encounter (Signed)
Pt have been having a lot of indigestion. She wants to know if she can take an extra Pantoprazole?

## 2014-08-02 NOTE — Telephone Encounter (Signed)
Called patient - she notes Dr. Claiborne Billings put her on Protonix - she had previously been on Nexium which may have worked a little better for her.  She does cite reflux symptoms only - stomach upset mainly - denies chest pain, dyspnea.  Will route for advice.

## 2014-08-04 ENCOUNTER — Ambulatory Visit (HOSPITAL_COMMUNITY)
Admission: RE | Admit: 2014-08-04 | Discharge: 2014-08-04 | Disposition: A | Discharge status: Home / Self Care | Payer: PPO | Source: Ambulatory Visit | Attending: Cardiology | Admitting: Cardiology

## 2014-08-04 DIAGNOSIS — I340 Nonrheumatic mitral (valve) insufficiency: Secondary | ICD-10-CM | POA: Diagnosis not present

## 2014-08-04 DIAGNOSIS — R002 Palpitations: Secondary | ICD-10-CM | POA: Diagnosis not present

## 2014-08-04 DIAGNOSIS — I25810 Atherosclerosis of coronary artery bypass graft(s) without angina pectoris: Secondary | ICD-10-CM

## 2014-08-04 DIAGNOSIS — I071 Rheumatic tricuspid insufficiency: Secondary | ICD-10-CM | POA: Diagnosis not present

## 2014-08-07 ENCOUNTER — Telehealth: Payer: Self-pay | Admitting: Cardiovascular Disease

## 2014-08-08 MED ORDER — PANTOPRAZOLE SODIUM 40 MG PO TBEC: 80.0000 mg | DELAYED_RELEASE_TABLET | Freq: Every day | ORAL | Status: DC

## 2014-08-08 NOTE — Telephone Encounter (Signed)
°  1. Which medications need to be refilled? Pantoprazole (take 1 bid for 2-3 wks)   2. Which pharmacy is medication to be sent to?[facility] in [place]   3. Do they need a 30 day or 90 day supply? 90  4. Would they like a call back once the medication has been sent to the pharmacy? Yes

## 2014-08-08 NOTE — Telephone Encounter (Signed)
Refill submitted to patient's preferred pharmacy. Informed patient. Pt voiced understanding, no other stated concerns at this time.  

## 2014-08-31 ENCOUNTER — Other Ambulatory Visit: Payer: Self-pay | Admitting: Cardiovascular Disease

## 2014-08-31 NOTE — Telephone Encounter (Signed)
REFILL 

## 2014-09-04 ENCOUNTER — Other Ambulatory Visit: Payer: Self-pay

## 2014-10-17 ENCOUNTER — Other Ambulatory Visit: Payer: Self-pay | Admitting: Cardiovascular Disease

## 2014-10-18 NOTE — Telephone Encounter (Signed)
Rx(s) sent to pharmacy electronically.  

## 2015-01-29 ENCOUNTER — Other Ambulatory Visit: Payer: Self-pay | Admitting: Cardiovascular Disease

## 2015-03-12 ENCOUNTER — Other Ambulatory Visit: Payer: Self-pay | Admitting: Cardiovascular Disease

## 2015-03-13 NOTE — Telephone Encounter (Signed)
Rx request sent to pharmacy.  

## 2015-03-27 DIAGNOSIS — H43812 Vitreous degeneration, left eye: Secondary | ICD-10-CM | POA: Diagnosis not present

## 2015-03-27 DIAGNOSIS — H2512 Age-related nuclear cataract, left eye: Secondary | ICD-10-CM | POA: Diagnosis not present

## 2015-03-27 DIAGNOSIS — H25811 Combined forms of age-related cataract, right eye: Secondary | ICD-10-CM | POA: Diagnosis not present

## 2015-03-28 DIAGNOSIS — H43812 Vitreous degeneration, left eye: Secondary | ICD-10-CM | POA: Diagnosis not present

## 2015-04-02 DIAGNOSIS — Z1389 Encounter for screening for other disorder: Secondary | ICD-10-CM | POA: Diagnosis not present

## 2015-04-02 DIAGNOSIS — Z6827 Body mass index (BMI) 27.0-27.9, adult: Secondary | ICD-10-CM | POA: Diagnosis not present

## 2015-04-02 DIAGNOSIS — M722 Plantar fascial fibromatosis: Secondary | ICD-10-CM | POA: Diagnosis not present

## 2015-04-16 ENCOUNTER — Other Ambulatory Visit: Payer: Self-pay | Admitting: Cardiovascular Disease

## 2015-04-26 DIAGNOSIS — Z0000 Encounter for general adult medical examination without abnormal findings: Secondary | ICD-10-CM | POA: Diagnosis not present

## 2015-04-26 DIAGNOSIS — E663 Overweight: Secondary | ICD-10-CM | POA: Diagnosis not present

## 2015-04-26 DIAGNOSIS — B719 Cestode infection, unspecified: Secondary | ICD-10-CM | POA: Diagnosis not present

## 2015-04-26 DIAGNOSIS — E782 Mixed hyperlipidemia: Secondary | ICD-10-CM | POA: Diagnosis not present

## 2015-04-26 DIAGNOSIS — Z1389 Encounter for screening for other disorder: Secondary | ICD-10-CM | POA: Diagnosis not present

## 2015-04-26 DIAGNOSIS — Z6827 Body mass index (BMI) 27.0-27.9, adult: Secondary | ICD-10-CM | POA: Diagnosis not present

## 2015-04-26 DIAGNOSIS — I10 Essential (primary) hypertension: Secondary | ICD-10-CM | POA: Diagnosis not present

## 2015-04-26 DIAGNOSIS — I2510 Atherosclerotic heart disease of native coronary artery without angina pectoris: Secondary | ICD-10-CM | POA: Diagnosis not present

## 2015-05-02 ENCOUNTER — Other Ambulatory Visit (HOSPITAL_COMMUNITY): Payer: Self-pay | Admitting: Family Medicine

## 2015-05-02 DIAGNOSIS — Z13820 Encounter for screening for osteoporosis: Secondary | ICD-10-CM

## 2015-05-07 ENCOUNTER — Encounter: Payer: Self-pay | Admitting: Cardiovascular Disease

## 2015-05-08 ENCOUNTER — Other Ambulatory Visit (HOSPITAL_COMMUNITY): Payer: PPO

## 2015-05-09 ENCOUNTER — Ambulatory Visit (HOSPITAL_COMMUNITY)
Admission: RE | Admit: 2015-05-09 | Discharge: 2015-05-09 | Disposition: A | Discharge status: Home / Self Care | Payer: PPO | Source: Ambulatory Visit | Attending: Family Medicine | Admitting: Family Medicine

## 2015-05-09 DIAGNOSIS — M5412 Radiculopathy, cervical region: Secondary | ICD-10-CM | POA: Diagnosis not present

## 2015-05-09 DIAGNOSIS — M8580 Other specified disorders of bone density and structure, unspecified site: Secondary | ICD-10-CM | POA: Diagnosis not present

## 2015-05-09 DIAGNOSIS — M8588 Other specified disorders of bone density and structure, other site: Secondary | ICD-10-CM | POA: Diagnosis not present

## 2015-05-09 DIAGNOSIS — Z13820 Encounter for screening for osteoporosis: Secondary | ICD-10-CM | POA: Diagnosis not present

## 2015-05-21 ENCOUNTER — Telehealth: Payer: Self-pay | Admitting: Cardiovascular Disease

## 2015-05-21 MED ORDER — METOPROLOL SUCCINATE ER 50 MG PO TB24: 50.0000 mg | ORAL_TABLET | Freq: Every day | ORAL | Status: DC

## 2015-05-21 NOTE — Telephone Encounter (Signed)
Follow Up   Pt called for med refills   *STAT* If patient is at the pharmacy, call can be transferred to refill team.   1. Which medications need to be refilled? (please list name of each medication and dose if known) metoprolol  2. Which pharmacy/location (including street and city if local pharmacy) is medication to be sent to? [facility] in [place]   3. Do they need a 30 day or 90 day supply?  90 day supply   Pt has appt scheduled on April 26th @ 10:15 with Dr. Claiborne Billings

## 2015-05-21 NOTE — Telephone Encounter (Signed)
Rx(s) sent to pharmacy electronically.  

## 2015-05-24 ENCOUNTER — Other Ambulatory Visit: Payer: Self-pay | Admitting: Cardiovascular Disease

## 2015-05-25 NOTE — Telephone Encounter (Signed)
Rx request sent to pharmacy.  

## 2015-07-03 DIAGNOSIS — H401131 Primary open-angle glaucoma, bilateral, mild stage: Secondary | ICD-10-CM | POA: Diagnosis not present

## 2015-07-04 ENCOUNTER — Encounter: Payer: Self-pay | Admitting: Cardiovascular Disease

## 2015-07-04 ENCOUNTER — Ambulatory Visit (INDEPENDENT_AMBULATORY_CARE_PROVIDER_SITE_OTHER): Payer: PPO | Admitting: Cardiovascular Disease

## 2015-07-04 VITALS — BP 128/70 | HR 64 | Ht 66.0 in | Wt 165.0 lb

## 2015-07-04 DIAGNOSIS — K219 Gastro-esophageal reflux disease without esophagitis: Secondary | ICD-10-CM

## 2015-07-04 DIAGNOSIS — R002 Palpitations: Secondary | ICD-10-CM | POA: Diagnosis not present

## 2015-07-04 DIAGNOSIS — E785 Hyperlipidemia, unspecified: Secondary | ICD-10-CM

## 2015-07-04 DIAGNOSIS — I2510 Atherosclerotic heart disease of native coronary artery without angina pectoris: Secondary | ICD-10-CM

## 2015-07-04 NOTE — Patient Instructions (Addendum)
Your physician wants you to follow-up in: 1 year or sooner if needed. You will receive a reminder letter in the mail two months in advance. If you don't receive a letter, please call our office to schedule the follow-up appointment.  Your physician has requested that you have en exercise stress myoview in 1 year. For further information please visit [email or web address]. Please follow instruction sheet, as given.    If you need a refill on your cardiac medications before your next appointment, please call your pharmacy.

## 2015-07-05 ENCOUNTER — Encounter: Payer: Self-pay | Admitting: Cardiovascular Disease

## 2015-07-05 DIAGNOSIS — R002 Palpitations: Secondary | ICD-10-CM | POA: Insufficient documentation

## 2015-07-05 NOTE — Progress Notes (Signed)
Patient ID: Kristin Taylor, female   DOB: [birth year], 70 y.o.   MRN: [id number]   Primary M.D.: Dr. Hilma Favors  HPI: Kristin Taylor is a 70 y.o. female who presents for one-year cardiology evaluation.  Kristin Taylor underwent CABG revascularization surgery in November 2001 after cardiac catheterization demonstrated 90% ostial left main stenosis. CABG surgery was done by Dr. Roxan Hockey and she had a LIMA graft placed to the LAD, and a RIMA graft placed to a ramus intermediate vessel. She does have a history of hyperlipidemia. At times she has noted some intermittent palpitations which have been controlled with beta blocker therapy. She has undergone prior hysterectomy and 2 years ago year underwent surgery for a prolapsed bladder and tolerated this well from a cardiovascular standpoint.  Her last nuclear perfusion study in October 2012 showed normal perfusion and function. Her last echo Doppler study was done in December 2013 which showed aortic valve sclerosis without stenosis, mild mitral regurgitation, moderate tricuspid regurgitation, and mild pulmonary hypertension with PA pressure 35 mm. Normal systolic function.  An echo Doppler study in June 2016 revealed normal ejection fraction at 55-60%.  She had normal diastolic parameters and there was mild mitral regurgitation and moderate tricuspid regurgitation.  Over the past year, Kristin. Taylor has continued to be stable. She exercises regularly at least 5 days per week.  She does the elliptical machine as well as low weight resistance, and walks on a treadmill.  She denies any anginal symptoms. She denies PND or orthopnea.  She has been on Toprol-XL 75 mg daily for her palpitations.  She has been on atorvastatin 10 mg Zetia 10 mg and fish oil for hyperlipidemia.  She also has GERD for which he takes Protonix.  She is on aspirin.  She states that Dr. Hilma Favors had checked her blood work and her labs were excellent.    Past Medical History  Diagnosis Date  . GERD  (gastroesophageal reflux disease)   . Hypertension   . Shortness of breath     on exertion  . Glaucoma   . S/P CABG x 2 2001  . CAD (coronary artery disease)   . Female bladder prolapse   . Hyperlipidemia     Past Surgical History  Procedure Laterality Date  . Coronary artery bypass graft  01/14/2000    LIMA-LAD, RIMA-ramus intermedius (Dr. Chauncey Cruel. Hendrickson)  . Cardiac catheterization  01/13/2000    90% eccentric ostial L main stenosis, LAD normal; Cfx normal; RCA normal (Dr. Corky Downs)   . Abdominal hysterectomy  1998  . Anterior and posterior repair    . Cystocele repair N/A 06/22/2012    Procedure: ANTERIOR REPAIR (CYSTOCELE);  Surgeon: Gus Height, MD;  Location: [facility];  Service: Gynecology;  Laterality: N/A;  . Colonoscopy  07/22/2005    RMR:Two cecal diverticula.  Remainder of the colonic mucosa appeared normal, normal rectum.  . Flexible sigmoidoscopy  2009    Dr. Watt Climes: ischemic colitis  . Colonoscopy with esophagogastroduodenoscopy (egd) N/A 05/11/2013    Procedure: COLONOSCOPY WITH ESOPHAGOGASTRODUODENOSCOPY (EGD);  Surgeon: Daneil Dolin, MD;  [facility];  Service: Endoscopy;  Laterality: N/A;  1045  . Transthoracic echocardiogram  02/2012    EF 55-60%; AB sclerosis; mild MR; mod TR; PA peak pressure 73mHg; normal CVP  . Nm myocar perf wall motion  12/2010    bruce myoview - normal perfusion, low risk    No Known Allergies  Current Outpatient Prescriptions  Medication Sig Dispense Refill  .  aspirin EC 81 MG tablet Take 81 mg by mouth daily.    Marland Kitchen atorvastatin (LIPITOR) 10 MG tablet Take 1 tablet (10 mg total) by mouth daily. 90 tablet 3  . BLACK COHOSH PO Take 1 tablet by mouth 2 (two) times daily.     . Calcium Carbonate (CALTRATE 600 PO) Take 1 tablet by mouth daily.    Marland Kitchen ezetimibe (ZETIA) 10 MG tablet Take 1 tablet (10 mg total) by mouth daily. 90 tablet 3  . fish oil-omega-3 fatty acids 1000 MG capsule Take 1 g by mouth 4 (four) times daily.    .  metoprolol succinate (TOPROL-XL) 50 MG 24 hr tablet Take 1 tablet (50 mg total) by mouth daily. 30 tablet 0  . Multiple Vitamin (MULTIVITAMIN WITH MINERALS) TABS Take 1 tablet by mouth daily.    . pantoprazole (PROTONIX) 40 MG tablet Take 2 tablets (80 mg total) by mouth daily. 180 tablet 1  . Polyethyl Glycol-Propyl Glycol (SYSTANE) 0.4-0.3 % SOLN Apply 1 drop to eye 2 (two) times daily.    . vitamin C (ASCORBIC ACID) 500 MG tablet Take 500 mg by mouth daily.     No current facility-administered medications for this visit.    Social History   Social History  . Marital Status: Divorced    Spouse Name: N/A  . Number of Children: 2  . Years of Education: N/A   Occupational History  . PT at church    Social History Main Topics  . Smoking status: Former Smoker    Types: Cigarettes    Quit date: 03/11/1999  . Smokeless tobacco: Never Used  . Alcohol Use: 0.6 oz/week    1 Glasses of wine per week     Comment: rarely  . Drug Use: No  . Sexual Activity: Not on file   Other Topics Concern  . Not on file   Social History Narrative   Socially she is single. She has 2 children. She is retired. She does work  at her  church. There is no tobacco use. She exercises daily.  Family History  Problem Relation Age of Onset  . Colon cancer Mother     age late 60s/early 68s.  . Cancer Father   . Cancer Maternal Grandmother   . Alzheimer's disease Maternal Grandfather    ROS General: Negative; No fevers, chills, or night sweats;  HEENT: Negative; No changes in vision or hearing, sinus congestion, difficulty swallowing Pulmonary: Negative; No cough, wheezing, shortness of breath, hemoptysis Cardiovascular: See history of present illness GI: Negative; No nausea, vomiting, diarrhea, or abdominal pain GU: Positive for GERD, controlled No dysuria, hematuria, or difficulty voiding Musculoskeletal: Negative; no myalgias, joint pain, or weakness Hematologic/Oncology: Negative; no easy  bruising, bleeding Endocrine: Negative; no heat/cold intolerance; no diabetes Neuro: Negative; no changes in balance, headaches Skin: Negative; No rashes or skin lesions Psychiatric: Negative; No behavioral problems, depression Sleep: Negative; No snoring, daytime sleepiness, hypersomnolence, bruxism, restless legs, hypnogognic hallucinations, no cataplexy Other comprehensive 14 point system review is negative.  PE BP 128/70 mmHg  Pulse 64  Ht 5\' 6"  (1.676 m)  Wt 165 lb (74.844 kg)  BMI 26.64 kg/m2   Repeat blood pressure 122/74 Wt Readings from Last 3 Encounters:  07/04/15 165 lb (74.844 kg)  07/31/14 156 lb 3.2 oz (70.852 kg)  06/10/13 159 lb 9.6 oz (72.394 kg)   General: Alert, oriented, no distress.  Skin: normal turgor, no rashes HEENT: Normocephalic, atraumatic. Pupils round and reactive; sclera anicteric;no lid lag.  Extraocular muscles intact;; no xanthelasmas. Nose without nasal septal hypertrophy Mouth/Parynx benign; Mallinpatti scale 2 Neck: No JVD, no carotid bruits; normal carotid upstroke Lungs: clear to ausculatation and percussion; no wheezing or rales Chest wall: no tenderness to palpitation Heart: Regular rhythm with occasional ectopy, sometimes occurring every third or fourth beat, isolated, s1 s2 normal; 8-9/2 systolic murmur in the aortic region. no diastolic murmur, rub thrills or heaves Abdomen: soft, nontender; no hepatosplenomehaly, BS+; abdominal aorta nontender and not dilated by palpation. Back: no CVA tenderness Pulses 2+ Extremities: no clubbing cyanosis or edema, Homan's sign negative  Neurologic: grossly nonfocal; cranial nerves grossly normal. Psychologic: normal affect and mood.  ECG (independently read by me): Normal sinus rhythm at 65.  No ectopy.  No ST segment changes.  May 2016 ECG (independently read by me): Sinus bradycardia at 54.  PAC.  April 2015 ECG (independently read by me): Sinus bradycardia 58 beats per minute. No ectopy. Normal  intervals.  LABS: BMP Latest Ref Rng 07/31/2014 06/13/2013 03/11/2013  Glucose 70 - 99 mg/dL 96 91 102(H)  BUN 6 - 23 mg/dL '15 14 16  '$ Creatinine 0.50 - 1.10 mg/dL 0.60 0.64 0.55  Sodium 135 - 145 mEq/L 138 142 141  Potassium 3.5 - 5.3 mEq/L 4.7 5.1 4.5  Chloride 96 - 112 mEq/L 107 105 106  CO2 19 - 32 mEq/L '29 28 26  '$ Calcium 8.4 - 10.5 mg/dL 10.7(H) 10.7(H) 10.0   Hepatic Function Latest Ref Rng 07/31/2014 06/13/2013 03/10/2013  Total Protein 6.0 - 8.3 g/dL 7.5 7.1 7.9  Albumin 3.5 - 5.2 g/dL 4.1 4.3 3.9  AST 0 - 37 U/L '20 17 18  '$ ALT 0 - 35 U/L '19 15 18  '$ Alk Phosphatase 39 - 117 U/L 79 78 91  Total Bilirubin 0.2 - 1.2 mg/dL 0.6 0.5 0.3   CBC Latest Ref Rng 07/31/2014 06/13/2013 03/11/2013  WBC 4.0 - 10.5 K/uL 7.2 5.7 10.9(H)  Hemoglobin 12.0 - 15.0 g/dL 14.0 13.5 13.1  Hematocrit 36.0 - 46.0 % 42.3 40.5 38.8  Platelets 150 - 400 K/uL 300 306 272   . Lab Results  Component Value Date   MCV 90.0 07/31/2014   MCV 88.6 06/13/2013   MCV 90.4 03/11/2013    No results found for: HGBA1C  Lab Results  Component Value Date   TSH 1.304 07/31/2014   Lipid Panel     Component Value Date/Time   CHOL 145 07/31/2014 0839   TRIG 82 07/31/2014 0839   HDL 73 07/31/2014 0839   CHOLHDL 2.0 07/31/2014 0839   VLDL 16 07/31/2014 0839   LDLCALC 56 07/31/2014 0839    RADIOLOGY: No results found.    ASSESSMENT AND PLAN: Kristin. Haberl is a very pleasant 70 year old African American female who is 15-1/2 years status post CABG revascularization surgery for high-grade ostial left main stenosis in November 2001. She has bilateral arterial conduits placed to her LAD and ramus intermediate vessel.  Her last nuclear perfusion in October 2012 showed normal perfusion. She does have a cardiac murmur.  Reviewed her most recent echo Doppler study from June 2016 which showed normal systolic function.  She had mild mitral regurgitation and moderate tricuspid regurgitation with trivial PR.  Has a history of occasional  palpitations with previously documented PACs.  These have been controlled on her current dose of Toprol-XL.   Blood pressure today is well controlled on Toprol-XL 50 mg.  She is on atorvastatin 10 mg, Zetia 10 mg of omega-3 fatty acids for hyperlipidemia.  GERD is controlled on Protonix.  She takes a baby aspirin for antiplatelet protection.  She remains asymptomatic with reference to chest pain or shortness of breath.  She continues to exercise.  I will try to obtain the blood work which had been done by Dr. Hilma Favors.  In one year, I am scheduling her to undergo an exercise Myoview study, and I will see her in follow-up for further evaluation.  Time spent: 25 minutes  Troy Sine, MD, [facility]  07/05/2015 2:12 PM

## 2015-07-23 NOTE — Addendum Note (Signed)
Addended by: Therisa Doyne on: 07/23/2015 02:33 PM   Modules accepted: Orders

## 2015-08-02 DIAGNOSIS — H401131 Primary open-angle glaucoma, bilateral, mild stage: Secondary | ICD-10-CM | POA: Diagnosis not present

## 2015-08-28 ENCOUNTER — Other Ambulatory Visit: Payer: Self-pay | Admitting: Cardiovascular Disease

## 2015-10-10 DIAGNOSIS — Z01419 Encounter for gynecological examination (general) (routine) without abnormal findings: Secondary | ICD-10-CM | POA: Diagnosis not present

## 2015-10-10 DIAGNOSIS — Z1231 Encounter for screening mammogram for malignant neoplasm of breast: Secondary | ICD-10-CM | POA: Diagnosis not present

## 2015-11-15 ENCOUNTER — Other Ambulatory Visit: Payer: Self-pay | Admitting: Cardiovascular Disease

## 2015-11-20 DIAGNOSIS — Z1389 Encounter for screening for other disorder: Secondary | ICD-10-CM | POA: Diagnosis not present

## 2015-11-20 DIAGNOSIS — Z6827 Body mass index (BMI) 27.0-27.9, adult: Secondary | ICD-10-CM | POA: Diagnosis not present

## 2015-11-20 DIAGNOSIS — I10 Essential (primary) hypertension: Secondary | ICD-10-CM | POA: Diagnosis not present

## 2015-11-20 DIAGNOSIS — M1991 Primary osteoarthritis, unspecified site: Secondary | ICD-10-CM | POA: Diagnosis not present

## 2015-11-20 DIAGNOSIS — I2510 Atherosclerotic heart disease of native coronary artery without angina pectoris: Secondary | ICD-10-CM | POA: Diagnosis not present

## 2015-11-28 ENCOUNTER — Other Ambulatory Visit: Payer: Self-pay | Admitting: Cardiovascular Disease

## 2015-12-24 DIAGNOSIS — Z0000 Encounter for general adult medical examination without abnormal findings: Secondary | ICD-10-CM | POA: Diagnosis not present

## 2016-02-28 ENCOUNTER — Encounter (HOSPITAL_COMMUNITY): Payer: Self-pay | Admitting: Emergency Medicine

## 2016-02-28 ENCOUNTER — Emergency Department (HOSPITAL_COMMUNITY)
Admission: EM | Admit: 2016-02-28 | Discharge: 2016-02-28 | Disposition: A | Discharge status: Home / Self Care | Payer: PPO | Attending: Emergency Medicine | Admitting: Emergency Medicine

## 2016-02-28 ENCOUNTER — Emergency Department (HOSPITAL_COMMUNITY): Payer: PPO

## 2016-02-28 DIAGNOSIS — I10 Essential (primary) hypertension: Secondary | ICD-10-CM | POA: Insufficient documentation

## 2016-02-28 DIAGNOSIS — Z87891 Personal history of nicotine dependence: Secondary | ICD-10-CM | POA: Diagnosis not present

## 2016-02-28 DIAGNOSIS — R200 Anesthesia of skin: Secondary | ICD-10-CM | POA: Diagnosis not present

## 2016-02-28 DIAGNOSIS — M792 Neuralgia and neuritis, unspecified: Secondary | ICD-10-CM

## 2016-02-28 DIAGNOSIS — Z79899 Other long term (current) drug therapy: Secondary | ICD-10-CM | POA: Insufficient documentation

## 2016-02-28 DIAGNOSIS — I2510 Atherosclerotic heart disease of native coronary artery without angina pectoris: Secondary | ICD-10-CM | POA: Diagnosis not present

## 2016-02-28 DIAGNOSIS — Z7982 Long term (current) use of aspirin: Secondary | ICD-10-CM | POA: Diagnosis not present

## 2016-02-28 DIAGNOSIS — R51 Headache: Secondary | ICD-10-CM | POA: Diagnosis not present

## 2016-02-28 LAB — CBC WITH DIFFERENTIAL/PLATELET
BASOS ABS: 0.1 10*3/uL (ref 0.0–0.1)
BASOS PCT: 1 %
EOS ABS: 0.1 10*3/uL (ref 0.0–0.7)
EOS PCT: 1 %
HCT: 43.1 % (ref 36.0–46.0)
HEMOGLOBIN: 14.3 g/dL (ref 12.0–15.0)
LYMPHS ABS: 2.5 10*3/uL (ref 0.7–4.0)
Lymphocytes Relative: 33 %
MCH: 30.2 pg (ref 26.0–34.0)
MCHC: 33.2 g/dL (ref 30.0–36.0)
MCV: 91.1 fL (ref 78.0–100.0)
Monocytes Absolute: 0.7 10*3/uL (ref 0.1–1.0)
Monocytes Relative: 9 %
NEUTROS PCT: 56 %
Neutro Abs: 4.3 10*3/uL (ref 1.7–7.7)
PLATELETS: 264 10*3/uL (ref 150–400)
RBC: 4.73 MIL/uL (ref 3.87–5.11)
RDW: 13 % (ref 11.5–15.5)
WBC: 7.6 10*3/uL (ref 4.0–10.5)

## 2016-02-28 LAB — COMPREHENSIVE METABOLIC PANEL
ALBUMIN: 4.1 g/dL (ref 3.5–5.0)
ALK PHOS: 73 U/L (ref 38–126)
ALT: 18 U/L (ref 14–54)
AST: 20 U/L (ref 15–41)
Anion gap: 5 (ref 5–15)
BUN: 15 mg/dL (ref 6–20)
CALCIUM: 11.1 mg/dL — AB (ref 8.9–10.3)
CHLORIDE: 106 mmol/L (ref 101–111)
CO2: 28 mmol/L (ref 22–32)
CREATININE: 0.61 mg/dL (ref 0.44–1.00)
GFR calc Af Amer: >60 mL/min (ref >60)
GFR calc non Af Amer: >60 mL/min (ref >60)
GLUCOSE: 103 mg/dL — AB (ref 65–99)
Potassium: 4.6 mmol/L (ref 3.5–5.1)
SODIUM: 139 mmol/L (ref 135–145)
Total Bilirubin: 0.5 mg/dL (ref 0.3–1.2)
Total Protein: 7.7 g/dL (ref 6.5–8.1)

## 2016-02-28 NOTE — ED Provider Notes (Signed)
[facility] DEPT Provider Note   CSN: [id number] Arrival date & time: 02/28/16  1104  By signing my name below, I, Kristin Taylor, attest that this documentation has been prepared under the direction and in the presence of Kristin Ferguson, MD. Electronically Signed: Judithann Taylor, ED Scribe. 02/28/16. 11:51 AM.   History   Chief Complaint Chief Complaint  Patient presents with  . Facial numbness    HPI Comments: Kristin Taylor is a 70 y.o. female with a hx of CAD who presents to the Emergency Department complaining of gradual onset, persistent moderate numbness to her right face, around cheek onset yesterday. She reports associated generalized headache during onset but that has since resolved on its own. She denies any recent falls, injuries, or trauma. No exacerbating factors noted. Pt has not tried any medications PTA. She has NKDA. She denies any fever, chills, focal weakness, nausea, vomiting, chest pain, or any other symptoms.   The history is provided by the patient. No language interpreter was used.  Headache   This is a new problem. The current episode started yesterday. The problem has been resolved. The headache is associated with nothing. Pain location: generalized. The pain is moderate. The pain does not radiate. Pertinent negatives include no fever, no nausea and no vomiting. Associated symptoms comments: Numbness to right cheek. She has tried nothing for the symptoms.    Past Medical History:  Diagnosis Date  . CAD (coronary artery disease)   . Female bladder prolapse   . GERD (gastroesophageal reflux disease)   . Glaucoma   . Hyperlipidemia   . Hypertension   . S/P CABG x 2 2001  . Shortness of breath    on exertion    Patient Active Problem List   Diagnosis Date Noted  . Palpitations 07/05/2015  . CAD (coronary artery disease) 06/10/2013  . History of prolapse of bladder 06/10/2013  . Hyperlipidemia with target LDL less than 70 06/10/2013  . Segmental  colitis ([place]) 04/28/2013  . GERD (gastroesophageal reflux disease) 04/28/2013  . Unspecified constipation 04/28/2013    Past Surgical History:  Procedure Laterality Date  . ABDOMINAL HYSTERECTOMY  1998  . ANTERIOR AND POSTERIOR REPAIR    . CARDIAC CATHETERIZATION  01/13/2000   90% eccentric ostial L main stenosis, LAD normal; Cfx normal; RCA normal (Dr. Corky Downs)   . COLONOSCOPY  07/22/2005   RMR:Two cecal diverticula.  Remainder of the colonic mucosa appeared normal, normal rectum.  . COLONOSCOPY WITH ESOPHAGOGASTRODUODENOSCOPY (EGD) N/A 05/11/2013   Procedure: COLONOSCOPY WITH ESOPHAGOGASTRODUODENOSCOPY (EGD);  Surgeon: Daneil Dolin, MD;  Location: [facility];  Service: Endoscopy;  Laterality: N/A;  1045  . CORONARY ARTERY BYPASS GRAFT  01/14/2000   LIMA-LAD, RIMA-ramus intermedius (Dr. Chauncey Cruel. Hendrickson)  . CYSTOCELE REPAIR N/A 06/22/2012   Procedure: ANTERIOR REPAIR (CYSTOCELE);  Surgeon: Gus Height, MD;  Location: [facility];  Service: Gynecology;  Laterality: N/A;  . FLEXIBLE SIGMOIDOSCOPY  2009   Dr. Watt Climes: ischemic colitis  . NM MYOCAR PERF WALL MOTION  12/2010   bruce myoview - normal perfusion, low risk  . TRANSTHORACIC ECHOCARDIOGRAM  02/2012   EF 55-60%; AB sclerosis; mild MR; mod TR; PA peak pressure 35mmHg; normal CVP    OB History    Gravida Para Term Preterm AB Living   2 2 2          SAB TAB Ectopic Multiple Live Births  Home Medications    Prior to Admission medications   Medication Sig Start Date End Date Taking? Authorizing Provider  aspirin EC 81 MG tablet Take 81 mg by mouth daily.    Historical Provider, MD  atorvastatin (LIPITOR) 10 MG tablet Take 1 tablet (10 mg total) by mouth daily. 11/15/15   Troy Sine, MD  BLACK COHOSH PO Take 1 tablet by mouth 2 (two) times daily.     Historical Provider, MD  Calcium Carbonate (CALTRATE 600 PO) Take 1 tablet by mouth daily.    Historical Provider, MD  ezetimibe (ZETIA) 10 MG tablet TAKE ONE TABLET  BY MOUTH ONCE DAILY 11/28/15   Troy Sine, MD  fish oil-omega-3 fatty acids 1000 MG capsule Take 1 g by mouth 4 (four) times daily.    Historical Provider, MD  metoprolol succinate (TOPROL-XL) 50 MG 24 hr tablet Take 1 tablet (50 mg total) by mouth daily. 05/25/15   Troy Sine, MD  metoprolol succinate (TOPROL-XL) 50 MG 24 hr tablet TAKE ONE TABLET BY MOUTH ONCE DAILY TAKE WITH  OR IMMEDIATELY FOLLOWING A MEAL 08/28/15   Troy Sine, MD  Multiple Vitamin (MULTIVITAMIN WITH MINERALS) TABS Take 1 tablet by mouth daily.    Historical Provider, MD  pantoprazole (PROTONIX) 40 MG tablet TAKE TWO TABLETS BY MOUTH ONCE DAILY 11/28/15   Troy Sine, MD  Polyethyl Glycol-Propyl Glycol (SYSTANE) 0.4-0.3 % SOLN Apply 1 drop to eye 2 (two) times daily.    Historical Provider, MD  vitamin C (ASCORBIC ACID) 500 MG tablet Take 500 mg by mouth daily.    Historical Provider, MD    Family History Family History  Problem Relation Age of Onset  . Colon cancer Mother     age late 60s/early 22s.  . Cancer Father   . Cancer Maternal Grandmother   . Alzheimer's disease Maternal Grandfather     Social History Social History  Substance Use Topics  . Smoking status: Former Smoker    Types: Cigarettes    Quit date: 03/11/1999  . Smokeless tobacco: Never Used  . Alcohol use 0.6 oz/week    1 Glasses of wine per week     Comment: rarely     Allergies   Patient has no known allergies.   Review of Systems Review of Systems  Constitutional: Negative for appetite change, fatigue and fever.  HENT: Negative for congestion, ear discharge and sinus pressure.   Eyes: Negative for discharge.  Respiratory: Negative for cough.   Cardiovascular: Negative for chest pain.  Gastrointestinal: Negative for abdominal pain, diarrhea, nausea and vomiting.  Genitourinary: Negative for frequency and hematuria.  Musculoskeletal: Negative for back pain.  Skin: Negative for rash.  Neurological: Positive for numbness  and headaches. Negative for seizures.  Psychiatric/Behavioral: Negative for hallucinations.     Physical Exam Updated Vital Signs BP 144/65 (BP Location: Left Arm)   Pulse 84   Temp 98.7 F (37.1 C) (Oral)   Resp 19   Ht 5\' 6"  (1.676 m)   Wt 163 lb (73.9 kg)   SpO2 100%   BMI 26.31 kg/m   Physical Exam  Constitutional: She is oriented to person, place, and time. She appears well-developed.  HENT:  Head: Normocephalic.  Minimal numbness to right cheek  Eyes: Conjunctivae and EOM are normal. No scleral icterus.  Neck: Neck supple. No thyromegaly present.  Cardiovascular: Normal rate and regular rhythm.  Exam reveals no gallop and no friction rub.   No murmur heard.  Pulmonary/Chest: No stridor. She has no wheezes. She has no rales. She exhibits no tenderness.  Abdominal: She exhibits no distension. There is no tenderness. There is no rebound.  Musculoskeletal: Normal range of motion. She exhibits no edema.  Lymphadenopathy:    She has no cervical adenopathy.  Neurological: She is oriented to person, place, and time. She exhibits normal muscle tone. Coordination normal.  Skin: No rash noted. No erythema.  Psychiatric: She has a normal mood and affect. Her behavior is normal.     ED Treatments / Results  DIAGNOSTIC STUDIES: Oxygen Saturation is 100% on RA, normal by my interpretation.    COORDINATION OF CARE: 11:47 AM- Pt advised of plan for treatment and pt agrees. Pt will receive CMP, CBC, MR Brain, and EKG for further evaluation.    Labs (all labs ordered are listed, but only abnormal results are displayed) Labs Reviewed - No data to display  EKG  EKG Interpretation None       Radiology No results found.  Procedures Procedures (including critical care time)  Medications Ordered in ED Medications - No data to display   Initial Impression / Assessment and Plan / ED Course  Kristin Ferguson, MD has reviewed the triage vital signs and the nursing  notes.  Pertinent labs & imaging results that were available during my care of the patient were reviewed by me and considered in my medical decision making (see chart for details).  Clinical Course     Labs unremarkable. MRI shows some microvascular disease. Most likely related to hypercholesteremia. Suspect peripheral neuritis. Patient will follow-up with PCP as needed  Final Clinical Impressions(s) / ED Diagnoses   Final diagnoses:  None    New Prescriptions New Prescriptions   No medications on file   The chart was scribed for me under my direct supervision.  I personally performed the history, physical, and medical decision making and all procedures in the evaluation of this patient.Kristin Ferguson, MD 02/28/16 [phone]

## 2016-02-28 NOTE — ED Notes (Signed)
Pt made aware to return if symptoms worsen or if any life threatening symptoms occur.   

## 2016-02-28 NOTE — ED Notes (Signed)
Pt taken to MRI by Audelia Acton.

## 2016-02-28 NOTE — Discharge Instructions (Signed)
Follow-up with your doctor in 1-2 weeks if not improving

## 2016-02-28 NOTE — ED Triage Notes (Signed)
Pt reports facial numbness to the R side of her face in an area around her jaws. Onset of symptoms yesterday. No slurred speech, no facial droop noted. Pt reports sudden onset of symptoms.

## 2016-03-11 DIAGNOSIS — Z6828 Body mass index (BMI) 28.0-28.9, adult: Secondary | ICD-10-CM | POA: Diagnosis not present

## 2016-03-11 DIAGNOSIS — M67442 Ganglion, left hand: Secondary | ICD-10-CM | POA: Diagnosis not present

## 2016-03-11 DIAGNOSIS — Z1389 Encounter for screening for other disorder: Secondary | ICD-10-CM | POA: Diagnosis not present

## 2016-04-22 DIAGNOSIS — R2232 Localized swelling, mass and lump, left upper limb: Secondary | ICD-10-CM | POA: Diagnosis not present

## 2016-04-25 ENCOUNTER — Telehealth: Payer: Self-pay | Admitting: *Deleted

## 2016-04-25 NOTE — Telephone Encounter (Signed)
Faxed surgical clearance to [facility] for approval to have left hand surgery.

## 2016-04-30 DIAGNOSIS — K219 Gastro-esophageal reflux disease without esophagitis: Secondary | ICD-10-CM | POA: Diagnosis not present

## 2016-04-30 DIAGNOSIS — Z0000 Encounter for general adult medical examination without abnormal findings: Secondary | ICD-10-CM | POA: Diagnosis not present

## 2016-04-30 DIAGNOSIS — Z6828 Body mass index (BMI) 28.0-28.9, adult: Secondary | ICD-10-CM | POA: Diagnosis not present

## 2016-04-30 DIAGNOSIS — E782 Mixed hyperlipidemia: Secondary | ICD-10-CM | POA: Diagnosis not present

## 2016-04-30 DIAGNOSIS — K5010 Crohn's disease of large intestine without complications: Secondary | ICD-10-CM | POA: Diagnosis not present

## 2016-04-30 DIAGNOSIS — E663 Overweight: Secondary | ICD-10-CM | POA: Diagnosis not present

## 2016-04-30 DIAGNOSIS — E669 Obesity, unspecified: Secondary | ICD-10-CM | POA: Diagnosis not present

## 2016-04-30 DIAGNOSIS — I10 Essential (primary) hypertension: Secondary | ICD-10-CM | POA: Diagnosis not present

## 2016-04-30 DIAGNOSIS — Z1389 Encounter for screening for other disorder: Secondary | ICD-10-CM | POA: Diagnosis not present

## 2016-05-01 DIAGNOSIS — E782 Mixed hyperlipidemia: Secondary | ICD-10-CM | POA: Diagnosis not present

## 2016-05-01 DIAGNOSIS — Z0000 Encounter for general adult medical examination without abnormal findings: Secondary | ICD-10-CM | POA: Diagnosis not present

## 2016-05-21 ENCOUNTER — Other Ambulatory Visit: Payer: Self-pay

## 2016-05-21 DIAGNOSIS — D2112 Benign neoplasm of connective and other soft tissue of left upper limb, including shoulder: Secondary | ICD-10-CM | POA: Diagnosis not present

## 2016-05-21 DIAGNOSIS — R2232 Localized swelling, mass and lump, left upper limb: Secondary | ICD-10-CM | POA: Diagnosis not present

## 2016-07-31 DIAGNOSIS — L255 Unspecified contact dermatitis due to plants, except food: Secondary | ICD-10-CM | POA: Diagnosis not present

## 2016-07-31 DIAGNOSIS — I10 Essential (primary) hypertension: Secondary | ICD-10-CM | POA: Diagnosis not present

## 2016-07-31 DIAGNOSIS — E782 Mixed hyperlipidemia: Secondary | ICD-10-CM | POA: Diagnosis not present

## 2016-07-31 DIAGNOSIS — M1991 Primary osteoarthritis, unspecified site: Secondary | ICD-10-CM | POA: Diagnosis not present

## 2016-07-31 DIAGNOSIS — K219 Gastro-esophageal reflux disease without esophagitis: Secondary | ICD-10-CM | POA: Diagnosis not present

## 2016-07-31 DIAGNOSIS — Z6828 Body mass index (BMI) 28.0-28.9, adult: Secondary | ICD-10-CM | POA: Diagnosis not present

## 2016-07-31 DIAGNOSIS — Z1389 Encounter for screening for other disorder: Secondary | ICD-10-CM | POA: Diagnosis not present

## 2016-10-24 ENCOUNTER — Other Ambulatory Visit: Payer: Self-pay | Admitting: Cardiovascular Disease

## 2016-10-30 DIAGNOSIS — H40013 Open angle with borderline findings, low risk, bilateral: Secondary | ICD-10-CM | POA: Diagnosis not present

## 2016-10-30 DIAGNOSIS — H25813 Combined forms of age-related cataract, bilateral: Secondary | ICD-10-CM | POA: Diagnosis not present

## 2016-10-30 DIAGNOSIS — H04123 Dry eye syndrome of bilateral lacrimal glands: Secondary | ICD-10-CM | POA: Diagnosis not present

## 2016-10-31 ENCOUNTER — Other Ambulatory Visit: Payer: Self-pay | Admitting: Cardiovascular Disease

## 2016-12-01 DIAGNOSIS — Z1231 Encounter for screening mammogram for malignant neoplasm of breast: Secondary | ICD-10-CM | POA: Diagnosis not present

## 2016-12-15 ENCOUNTER — Other Ambulatory Visit: Payer: Self-pay | Admitting: Cardiovascular Disease

## 2016-12-15 NOTE — Telephone Encounter (Signed)
Rx request sent to pharmacy.  

## 2016-12-19 DIAGNOSIS — H25813 Combined forms of age-related cataract, bilateral: Secondary | ICD-10-CM | POA: Diagnosis not present

## 2016-12-19 DIAGNOSIS — H04123 Dry eye syndrome of bilateral lacrimal glands: Secondary | ICD-10-CM | POA: Diagnosis not present

## 2016-12-19 DIAGNOSIS — H40013 Open angle with borderline findings, low risk, bilateral: Secondary | ICD-10-CM | POA: Diagnosis not present

## 2016-12-19 DIAGNOSIS — H402222 Chronic angle-closure glaucoma, left eye, moderate stage: Secondary | ICD-10-CM | POA: Diagnosis not present

## 2016-12-26 DIAGNOSIS — H402212 Chronic angle-closure glaucoma, right eye, moderate stage: Secondary | ICD-10-CM | POA: Diagnosis not present

## 2017-01-09 DIAGNOSIS — H04123 Dry eye syndrome of bilateral lacrimal glands: Secondary | ICD-10-CM | POA: Diagnosis not present

## 2017-01-09 DIAGNOSIS — H402222 Chronic angle-closure glaucoma, left eye, moderate stage: Secondary | ICD-10-CM | POA: Diagnosis not present

## 2017-01-09 DIAGNOSIS — H25813 Combined forms of age-related cataract, bilateral: Secondary | ICD-10-CM | POA: Diagnosis not present

## 2017-01-09 DIAGNOSIS — H43813 Vitreous degeneration, bilateral: Secondary | ICD-10-CM | POA: Diagnosis not present

## 2017-01-09 DIAGNOSIS — H40013 Open angle with borderline findings, low risk, bilateral: Secondary | ICD-10-CM | POA: Diagnosis not present

## 2017-01-15 ENCOUNTER — Other Ambulatory Visit: Payer: Self-pay | Admitting: Cardiovascular Disease

## 2017-01-15 NOTE — Telephone Encounter (Signed)
Rx(s) sent to pharmacy electronically.  

## 2017-01-19 ENCOUNTER — Other Ambulatory Visit: Payer: Self-pay | Admitting: *Deleted

## 2017-01-19 MED ORDER — EZETIMIBE 10 MG PO TABS: 10.0000 mg | ORAL_TABLET | Freq: Every day | ORAL | 0 refills | Status: DC

## 2017-01-19 MED ORDER — PANTOPRAZOLE SODIUM 40 MG PO TBEC
80.0000 mg | DELAYED_RELEASE_TABLET | Freq: Every day | ORAL | 0 refills | Status: DC
Start: 2017-01-19 — End: 2018-03-04

## 2017-01-19 NOTE — Telephone Encounter (Signed)
REFILL 

## 2017-02-05 ENCOUNTER — Ambulatory Visit: Payer: PPO | Admitting: Cardiovascular Disease

## 2017-02-05 ENCOUNTER — Encounter: Payer: Self-pay | Admitting: Cardiovascular Disease

## 2017-02-05 VITALS — BP 122/72 | HR 51 | Ht 66.0 in | Wt 171.6 lb

## 2017-02-05 DIAGNOSIS — R002 Palpitations: Secondary | ICD-10-CM

## 2017-02-05 DIAGNOSIS — E785 Hyperlipidemia, unspecified: Secondary | ICD-10-CM

## 2017-02-05 DIAGNOSIS — Z951 Presence of aortocoronary bypass graft: Secondary | ICD-10-CM | POA: Diagnosis not present

## 2017-02-05 DIAGNOSIS — I2510 Atherosclerotic heart disease of native coronary artery without angina pectoris: Secondary | ICD-10-CM

## 2017-02-05 DIAGNOSIS — K219 Gastro-esophageal reflux disease without esophagitis: Secondary | ICD-10-CM | POA: Diagnosis not present

## 2017-02-05 MED ORDER — ATORVASTATIN CALCIUM 20 MG PO TABS: 20.0000 mg | ORAL_TABLET | Freq: Every day | ORAL | 3 refills | Status: DC

## 2017-02-05 NOTE — Progress Notes (Signed)
Patient ID: Kristin Taylor, female   DOB: [birth year], 71 y.o.   MRN: [id number]   Primary M.D.: Dr. Hilma Favors  HPI: Kristin Taylor is a 71 y.o. female who presents for one-year cardiology evaluation.  Kristin Taylor underwent CABG revascularization surgery in November 2001 after cardiac catheterization demonstrated 90% ostial left main stenosis. CABG surgery was done by Dr. Roxan Hockey and she had a LIMA graft placed to the LAD, and a RIMA graft placed to a ramus intermediate vessel. She does have a history of hyperlipidemia. At times she has noted some intermittent palpitations which have been controlled with beta blocker therapy. She has undergone prior hysterectomy and 2 years ago year underwent surgery for a prolapsed bladder and tolerated this well from a cardiovascular standpoint.  Her last nuclear perfusion study in October 2012 showed normal perfusion and function. Her last echo Doppler study was done in December 2013 which showed aortic valve sclerosis without stenosis, mild mitral regurgitation, moderate tricuspid regurgitation, and mild pulmonary hypertension with PA pressure 35 mm. Normal systolic function.  An echo Doppler study in June 2016 revealed normal ejection fraction at 55-60%.  She had normal diastolic parameters and there was mild mitral regurgitation and moderate tricuspid regurgitation.  Since I last saw her in April 2017, she has continued to remain stable.  She is exercising 4 days per week, typically walking on a treadmill and riding a stationary bicycle.  She also does yard work.  Her activity is without restrictions.  She denies chest pain, PND, orthopnea, palpitations.  Had blood work done by Dr. Hilma Favors earlier this year and her total cholesterol was 153, HDL 67, LDL 75, triglycerides 54.  She has continued to be on Zetia 10 mg and atorvastatin 10 mg.  In addition, she is on Toprol-XL 50 mg daily.  She takes Protonix 40 mg for GERD.  She presents for a 70-monthold follow-up evaluation.      Past Medical History:  Diagnosis Date  . CAD (coronary artery disease)   . Female bladder prolapse   . GERD (gastroesophageal reflux disease)   . Glaucoma   . Hyperlipidemia   . Hypertension   . S/P CABG x 2 2001  . Shortness of breath    on exertion    Past Surgical History:  Procedure Laterality Date  . ABDOMINAL HYSTERECTOMY  1998  . ANTERIOR AND POSTERIOR REPAIR    . CARDIAC CATHETERIZATION  01/13/2000   90% eccentric ostial L main stenosis, LAD normal; Cfx normal; RCA normal (Dr. TCorky Downs   . COLONOSCOPY  07/22/2005   RMR:Two cecal diverticula.  Remainder of the colonic mucosa appeared normal, normal rectum.  . COLONOSCOPY WITH ESOPHAGOGASTRODUODENOSCOPY (EGD) N/A 05/11/2013   Procedure: COLONOSCOPY WITH ESOPHAGOGASTRODUODENOSCOPY (EGD);  Surgeon: RDaneil Dolin MD;  [facility]: [facility];  Service: Endoscopy;  Laterality: N/A;  1045  . CORONARY ARTERY BYPASS GRAFT  01/14/2000   LIMA-LAD, RIMA-ramus intermedius (Dr. SChauncey Cruel Hendrickson)  . CYSTOCELE REPAIR N/A 06/22/2012   Procedure: ANTERIOR REPAIR (CYSTOCELE);  Surgeon: AGus Height MD;  [facility];  Service: Gynecology;  Laterality: N/A;  . FLEXIBLE SIGMOIDOSCOPY  2009   Dr. MWatt Climes ischemic colitis  . NM MYOCAR PERF WALL MOTION  12/2010   bruce myoview - normal perfusion, low risk  . TRANSTHORACIC ECHOCARDIOGRAM  02/2012   EF 55-60%; AB sclerosis; mild MR; mod TR; PA peak pressure 367mg; normal CVP    No Known Allergies  Current Outpatient Medications  Medication Sig Dispense Refill  .  aspirin EC 81 MG tablet Take 81 mg by mouth daily.    Marland Kitchen atorvastatin (LIPITOR) 20 MG tablet Take 1 tablet (20 mg total) by mouth daily. 90 tablet 3  . BLACK COHOSH PO Take 1 tablet by mouth 2 (two) times daily.     . Brimonidine Tartrate-Timolol (COMBIGAN OP) Place 1 drop into the left eye 2 (two) times daily.    . Calcium Carbonate (CALTRATE 600 PO) Take 1 tablet by mouth daily.    Marland Kitchen co-enzyme Q-10 30 MG capsule Take 30 mg by  mouth daily.    Marland Kitchen ezetimibe (ZETIA) 10 MG tablet Take 1 tablet (10 mg total) daily by mouth. 90 tablet 0  . fish oil-omega-3 fatty acids 1000 MG capsule Take 1 g by mouth 4 (four) times daily.    Marland Kitchen latanoprost (XALATAN) 0.005 % ophthalmic solution Place 1 drop into both eyes at bedtime.    . meloxicam (MOBIC) 15 MG tablet Take 15 mg by mouth as needed.    . metoprolol succinate (TOPROL-XL) 50 MG 24 hr tablet Take 1 tablet (50 mg total) by mouth daily. 30 tablet 0  . Multiple Vitamin (MULTIVITAMIN WITH MINERALS) TABS Take 1 tablet by mouth daily.    . pantoprazole (PROTONIX) 40 MG tablet Take 2 tablets (80 mg total) daily by mouth. 180 tablet 0  . Polyethyl Glycol-Propyl Glycol (SYSTANE) 0.4-0.3 % SOLN Apply 1 drop to eye 2 (two) times daily.    . TURMERIC PO Take 1 tablet by mouth daily.    . vitamin C (ASCORBIC ACID) 500 MG tablet Take 500 mg by mouth daily.     No current facility-administered medications for this visit.     Social History   Socioeconomic History  . Marital status: Divorced    Spouse name: Not on file  . Number of children: 2  . Years of education: Not on file  . Highest education level: Not on file  Social Needs  . Financial resource strain: Not on file  . Food insecurity - worry: Not on file  . Food insecurity - inability: Not on file  . Transportation needs - medical: Not on file  . Transportation needs - non-medical: Not on file  Occupational History  . 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 Smoking status: Former Smoker    Types: Cigarettes    Last attempt to quit: 03/11/1999    Years since quitting: 17.9  . Smokeless tobacco: Never Used  Substance and Sexual Activity  . Alcohol use: Yes    Alcohol/week: 0.6 oz    Types: 1 Glasses of wine per week    Comment: rarely  . Drug use: No  . Sexual activity: Not on file  Other Topics Concern  . Not on file  Social History Narrative  . Not on file   Socially she is single. She has  2 children. She is retired. She does work  at her  church. There is no tobacco use. She exercises daily.  Family History  Problem Relation Age of Onset  . Colon cancer Mother        age late 60s/early 68s.  . Cancer Father   . Cancer Maternal Grandmother   . Alzheimer's disease Maternal Grandfather    ROS General: Negative; No fevers, chills, or night sweats;  HEENT: Negative; No changes in vision or hearing, sinus congestion, difficulty swallowing Pulmonary: Negative; No cough, wheezing, shortness of breath, hemoptysis Cardiovascular: See history of present  illness GI: Negative; No nausea, vomiting, diarrhea, or abdominal pain GU: Positive for GERD, controlled No dysuria, hematuria, or difficulty voiding Musculoskeletal: Negative; no myalgias, joint pain, or weakness Hematologic/Oncology: Negative; no easy bruising, bleeding Endocrine: Negative; no heat/cold intolerance; no diabetes Neuro: Negative; no changes in balance, headaches Skin: Negative; No rashes or skin lesions Psychiatric: Negative; No behavioral problems, depression Sleep: Negative; No snoring, daytime sleepiness, hypersomnolence, bruxism, restless legs, hypnogognic hallucinations, no cataplexy Other comprehensive 14 point system review is negative.  PE BP 122/72   Pulse (!) 51   Ht _0  (1.676 m)   Wt 171 lb 9.6 oz (77.8 kg)   BMI 27.70 kg/m    Repeat blood pressure by me was 120/70  Wt Readings from Last 3 Encounters:  02/05/17 171 lb 9.6 oz (77.8 kg)  02/28/16 163 lb (73.9 kg)  07/04/15 165 lb (74.8 kg)   General: Alert, oriented, no distress.  Skin: normal turgor, no rashes, warm and dry HEENT: Normocephalic, atraumatic. Pupils equal round and reactive to light; sclera anicteric; extraocular muscles intact; Nose without nasal septal hypertrophy Mouth/Parynx benign; Mallinpatti scale 2 Neck: No JVD, no carotid bruits; normal carotid upstroke Lungs: clear to ausculatation and percussion; no wheezing or  rales Chest wall: without tenderness to palpitation Heart: PMI not displaced, RRR, s1 s2 normal, 1/6 systolic murmur, no diastolic murmur, no rubs, gallops, thrills, or heaves Abdomen: soft, nontender; no hepatosplenomehaly, BS+; abdominal aorta nontender and not dilated by palpation. Back: no CVA tenderness Pulses 2+ Musculoskeletal: full range of motion, normal strength, no joint deformities Extremities: no clubbing cyanosis or edema, Homan's sign negative  Neurologic: grossly nonfocal; Cranial nerves grossly wnl Psychologic: Normal mood and affect; normal cognition   ECG (independently read by me): Sinus bradycardia 51 bpm.  Normal intervals.  No ST segment changes.  No ectopy.  April 2017 ECG (independently read by me): Normal sinus rhythm at 65.  No ectopy.  No ST segment changes.  May 2016 ECG (independently read by me): Sinus bradycardia at 54.  PAC.  April 2015 ECG (independently read by me): Sinus bradycardia 58 beats per minute. No ectopy. Normal intervals.  LABS: BMP Latest Ref Rng & Units 02/28/2016 07/31/2014 06/13/2013  Glucose 65 - 99 mg/dL 103(H) 96 91  BUN 6 - 20 mg/dL _1 Creatinine 0.44 - 1.00 mg/dL 0.61 0.60 0.64  Sodium 135 - 145 mmol/L 139 138 142  Potassium 3.5 - 5.1 mmol/L 4.6 4.7 5.1  Chloride 101 - 111 mmol/L 106 107 105  CO2 22 - 32 mmol/L _2 Calcium 8.9 - 10.3 mg/dL 11.1(H) 10.7(H) 10.7(H)   Hepatic Function Latest Ref Rng & Units 02/28/2016 07/31/2014 06/13/2013  Total Protein 6.5 - 8.1 g/dL 7.7 7.5 7.1  Albumin 3.5 - 5.0 g/dL 4.1 4.1 4.3  AST 15 - 41 U/L _3 ALT 14 - 54 U/L _4 Alk Phosphatase 38 - 126 U/L 73 79 78  Total Bilirubin 0.3 - 1.2 mg/dL 0.5 0.6 0.5   CBC Latest Ref Rng & Units 02/28/2016 07/31/2014 06/13/2013  WBC 4.0 - 10.5 K/uL 7.6 7.2 5.7  Hemoglobin 12.0 - 15.0 g/dL 14.3 14.0 13.5  Hematocrit 36.0 - 46.0 % 43.1 42.3 40.5  Platelets 150 - 400 K/uL 264 300 306   . Lab Results  Component Value Date   MCV 91.1  02/28/2016   MCV 90.0 07/31/2014   MCV 88.6 06/13/2013    No results found for: HGBA1C  Lab Results  Component Value Date   TSH 1.304 07/31/2014   Lipid Panel     Component Value Date/Time   CHOL 145 07/31/2014 0839   TRIG 82 07/31/2014 0839   HDL 73 07/31/2014 0839   CHOLHDL 2.0 07/31/2014 0839   VLDL 16 07/31/2014 0839   LDLCALC 56 07/31/2014 0839    RADIOLOGY: No results found.  IMPRESSION:  1. Atherosclerosis of native coronary artery of native heart without angina pectoris   2. Hyperlipidemia with target LDL less than 70   3. Hx of CABG   4. Gastroesophageal reflux disease without esophagitis   5. Palpitations     ASSESSMENT AND PLAN: Kristin Taylor is a very pleasant young appearing 71 year-old [place] American female who is 17 years status post CABG revascularization surgery for high-grade ostial left main stenosis in November 2001. She has bilateral arterial conduits placed to her LAD and ramus intermediate vessel.  Her last nuclear perfusion in October 2012 showed normal perfusion. .  She has a soft systolic murmur in her last echo Doppler study in June 2016 showed normal systolic function with mild MR and moderate TR with trivial PR.  She's not having any episodes of palpitations on her current dose of Toprol 50 mg daily.  She's continued to be asymptomatic with reference to her CAD.  I reviewed the blood work which was done by Dr. Hilma Favors.  Since her LDL was 75, and with recent data suggesting continued lowering of LDL is better with reference to future cardiovascular risk, I have recommended she increase atorvastatin to 20 mg and she will continue the Zetia 10 mg daily.  She also takes over-the-counter omega-3 fatty acids.  Triglycerides were excellent.  She will have repeat lab work with Dr. Hilma Favors in February 2019.  I commended her on her activity and exercise.  Her GERD is controlled with Protonix.  As long she remains stable, I will see her in one year for  evaluation.  Time spent: 25 minutes  Troy Sine, MD, [facility]  02/05/2017 8:37 AM

## 2017-02-05 NOTE — Patient Instructions (Signed)
Medication Instructions:  INCREASE atorvastatin (Lipitor) to 20 mg daily  Labwork: Labs at PCP in February  Follow-Up: Your physician wants you to follow-up in: 12 months with Dr. Claiborne Billings. You will receive a reminder letter in the mail two months in advance. If you don't receive a letter, please call our office to schedule the follow-up appointment.   Any Other Special Instructions Will Be Listed Below (If Applicable).     If you need a refill on your cardiac medications before your next appointment, please call your pharmacy.

## 2017-02-20 DIAGNOSIS — H43813 Vitreous degeneration, bilateral: Secondary | ICD-10-CM | POA: Diagnosis not present

## 2017-02-20 DIAGNOSIS — H04123 Dry eye syndrome of bilateral lacrimal glands: Secondary | ICD-10-CM | POA: Diagnosis not present

## 2017-02-20 DIAGNOSIS — H25813 Combined forms of age-related cataract, bilateral: Secondary | ICD-10-CM | POA: Diagnosis not present

## 2017-02-20 DIAGNOSIS — H40013 Open angle with borderline findings, low risk, bilateral: Secondary | ICD-10-CM | POA: Diagnosis not present

## 2017-02-20 DIAGNOSIS — H402222 Chronic angle-closure glaucoma, left eye, moderate stage: Secondary | ICD-10-CM | POA: Diagnosis not present

## 2017-04-01 ENCOUNTER — Other Ambulatory Visit: Payer: Self-pay | Admitting: Cardiovascular Disease

## 2017-04-30 DIAGNOSIS — I10 Essential (primary) hypertension: Secondary | ICD-10-CM | POA: Diagnosis not present

## 2017-04-30 DIAGNOSIS — K219 Gastro-esophageal reflux disease without esophagitis: Secondary | ICD-10-CM | POA: Diagnosis not present

## 2017-04-30 DIAGNOSIS — Z1389 Encounter for screening for other disorder: Secondary | ICD-10-CM | POA: Diagnosis not present

## 2017-04-30 DIAGNOSIS — E782 Mixed hyperlipidemia: Secondary | ICD-10-CM | POA: Diagnosis not present

## 2017-04-30 DIAGNOSIS — Z0001 Encounter for general adult medical examination with abnormal findings: Secondary | ICD-10-CM | POA: Diagnosis not present

## 2017-04-30 DIAGNOSIS — M2041 Other hammer toe(s) (acquired), right foot: Secondary | ICD-10-CM | POA: Diagnosis not present

## 2017-04-30 DIAGNOSIS — M1991 Primary osteoarthritis, unspecified site: Secondary | ICD-10-CM | POA: Diagnosis not present

## 2017-04-30 DIAGNOSIS — Z6827 Body mass index (BMI) 27.0-27.9, adult: Secondary | ICD-10-CM | POA: Diagnosis not present

## 2017-05-01 DIAGNOSIS — E782 Mixed hyperlipidemia: Secondary | ICD-10-CM | POA: Diagnosis not present

## 2017-05-01 DIAGNOSIS — I10 Essential (primary) hypertension: Secondary | ICD-10-CM | POA: Diagnosis not present

## 2017-05-04 ENCOUNTER — Telehealth: Payer: Self-pay | Admitting: Cardiovascular Disease

## 2017-05-04 NOTE — Telephone Encounter (Signed)
°*  STAT* If patient is at the pharmacy, call can be transferred to refill team.   1. Which medications need to be refilled? (please list name of each medication and dose if known) atorvastatin (LIPITOR) 20 MG tablet 2. Which pharmacy/location (including street and city if local pharmacy) is medication to be sent to? [facility] in [place]  3. Do they need a 30 day or 90 day supply?  30  Pt want a 40mg  prescriptions sent to pharmacy

## 2017-05-04 NOTE — Telephone Encounter (Signed)
Spoke with pt, she is wanting to know if dr Claiborne Billings has gotten her lab results and if she would need to continue the atorvastatin 40 mg daily. Will forward to dr Claiborne Billings

## 2017-05-04 NOTE — Telephone Encounter (Signed)
LDL was 75.  Recommend atorvastatin 40 mg

## 2017-05-05 MED ORDER — ATORVASTATIN CALCIUM 40 MG PO TABS: 40.0000 mg | ORAL_TABLET | Freq: Every day | ORAL | 3 refills | Status: DC

## 2017-05-05 NOTE — Telephone Encounter (Signed)
Patient aware and verbalized understanding.  rx sent to pharmacy. 

## 2017-05-05 NOTE — Telephone Encounter (Signed)
Left message to call back  

## 2017-05-07 DIAGNOSIS — R7309 Other abnormal glucose: Secondary | ICD-10-CM | POA: Diagnosis not present

## 2017-05-25 DIAGNOSIS — M2041 Other hammer toe(s) (acquired), right foot: Secondary | ICD-10-CM | POA: Diagnosis not present

## 2017-05-25 DIAGNOSIS — M79672 Pain in left foot: Secondary | ICD-10-CM | POA: Diagnosis not present

## 2017-05-25 DIAGNOSIS — L89892 Pressure ulcer of other site, stage 2: Secondary | ICD-10-CM | POA: Diagnosis not present

## 2017-05-29 DIAGNOSIS — E663 Overweight: Secondary | ICD-10-CM | POA: Diagnosis not present

## 2017-05-29 DIAGNOSIS — I2510 Atherosclerotic heart disease of native coronary artery without angina pectoris: Secondary | ICD-10-CM | POA: Diagnosis not present

## 2017-05-29 DIAGNOSIS — I10 Essential (primary) hypertension: Secondary | ICD-10-CM | POA: Diagnosis not present

## 2017-05-29 DIAGNOSIS — L89891 Pressure ulcer of other site, stage 1: Secondary | ICD-10-CM | POA: Diagnosis not present

## 2017-05-29 DIAGNOSIS — Z1389 Encounter for screening for other disorder: Secondary | ICD-10-CM | POA: Diagnosis not present

## 2017-05-29 DIAGNOSIS — Z6828 Body mass index (BMI) 28.0-28.9, adult: Secondary | ICD-10-CM | POA: Diagnosis not present

## 2017-06-01 DIAGNOSIS — H43813 Vitreous degeneration, bilateral: Secondary | ICD-10-CM | POA: Diagnosis not present

## 2017-06-01 DIAGNOSIS — H40013 Open angle with borderline findings, low risk, bilateral: Secondary | ICD-10-CM | POA: Diagnosis not present

## 2017-06-01 DIAGNOSIS — H402222 Chronic angle-closure glaucoma, left eye, moderate stage: Secondary | ICD-10-CM | POA: Diagnosis not present

## 2017-06-01 DIAGNOSIS — H04123 Dry eye syndrome of bilateral lacrimal glands: Secondary | ICD-10-CM | POA: Diagnosis not present

## 2017-06-01 DIAGNOSIS — H25813 Combined forms of age-related cataract, bilateral: Secondary | ICD-10-CM | POA: Diagnosis not present

## 2017-06-02 DIAGNOSIS — M2041 Other hammer toe(s) (acquired), right foot: Secondary | ICD-10-CM | POA: Diagnosis not present

## 2017-06-02 DIAGNOSIS — M79672 Pain in left foot: Secondary | ICD-10-CM | POA: Diagnosis not present

## 2017-06-02 DIAGNOSIS — L89892 Pressure ulcer of other site, stage 2: Secondary | ICD-10-CM | POA: Diagnosis not present

## 2017-07-13 DIAGNOSIS — H04123 Dry eye syndrome of bilateral lacrimal glands: Secondary | ICD-10-CM | POA: Diagnosis not present

## 2017-07-13 DIAGNOSIS — H25813 Combined forms of age-related cataract, bilateral: Secondary | ICD-10-CM | POA: Diagnosis not present

## 2017-07-13 DIAGNOSIS — H40013 Open angle with borderline findings, low risk, bilateral: Secondary | ICD-10-CM | POA: Diagnosis not present

## 2017-07-13 DIAGNOSIS — H402222 Chronic angle-closure glaucoma, left eye, moderate stage: Secondary | ICD-10-CM | POA: Diagnosis not present

## 2017-07-13 DIAGNOSIS — H43813 Vitreous degeneration, bilateral: Secondary | ICD-10-CM | POA: Diagnosis not present

## 2017-07-20 ENCOUNTER — Ambulatory Visit (HOSPITAL_COMMUNITY)
Admission: RE | Admit: 2017-07-20 | Discharge: 2017-07-20 | Disposition: A | Discharge status: Home / Self Care | Payer: Medicare HMO | Source: Ambulatory Visit | Attending: Family Medicine | Admitting: Family Medicine

## 2017-07-20 ENCOUNTER — Other Ambulatory Visit (HOSPITAL_COMMUNITY): Payer: Self-pay | Admitting: Family Medicine

## 2017-07-20 DIAGNOSIS — M7121 Synovial cyst of popliteal space [Baker], right knee: Secondary | ICD-10-CM | POA: Insufficient documentation

## 2017-07-20 DIAGNOSIS — M79661 Pain in right lower leg: Secondary | ICD-10-CM

## 2017-07-20 DIAGNOSIS — Z6828 Body mass index (BMI) 28.0-28.9, adult: Secondary | ICD-10-CM | POA: Diagnosis not present

## 2017-07-20 DIAGNOSIS — Z1389 Encounter for screening for other disorder: Secondary | ICD-10-CM | POA: Diagnosis not present

## 2017-07-20 DIAGNOSIS — E663 Overweight: Secondary | ICD-10-CM | POA: Diagnosis not present

## 2017-07-23 ENCOUNTER — Other Ambulatory Visit: Payer: Self-pay | Admitting: Cardiovascular Disease

## 2017-07-24 NOTE — Telephone Encounter (Signed)
Rx sent to pharmacy   

## 2017-08-04 DIAGNOSIS — M25579 Pain in unspecified ankle and joints of unspecified foot: Secondary | ICD-10-CM | POA: Diagnosis not present

## 2017-08-04 DIAGNOSIS — M2041 Other hammer toe(s) (acquired), right foot: Secondary | ICD-10-CM | POA: Diagnosis not present

## 2017-08-04 DIAGNOSIS — M79671 Pain in right foot: Secondary | ICD-10-CM | POA: Diagnosis not present

## 2017-08-04 DIAGNOSIS — M79674 Pain in right toe(s): Secondary | ICD-10-CM | POA: Diagnosis not present

## 2017-08-13 DIAGNOSIS — H25813 Combined forms of age-related cataract, bilateral: Secondary | ICD-10-CM | POA: Diagnosis not present

## 2017-08-13 DIAGNOSIS — H40013 Open angle with borderline findings, low risk, bilateral: Secondary | ICD-10-CM | POA: Diagnosis not present

## 2017-08-13 DIAGNOSIS — H04123 Dry eye syndrome of bilateral lacrimal glands: Secondary | ICD-10-CM | POA: Diagnosis not present

## 2017-08-13 DIAGNOSIS — H43813 Vitreous degeneration, bilateral: Secondary | ICD-10-CM | POA: Diagnosis not present

## 2017-08-13 DIAGNOSIS — H402222 Chronic angle-closure glaucoma, left eye, moderate stage: Secondary | ICD-10-CM | POA: Diagnosis not present

## 2017-08-31 DIAGNOSIS — H04129 Dry eye syndrome of unspecified lacrimal gland: Secondary | ICD-10-CM | POA: Diagnosis not present

## 2017-08-31 DIAGNOSIS — K5900 Constipation, unspecified: Secondary | ICD-10-CM | POA: Diagnosis not present

## 2017-08-31 DIAGNOSIS — R69 Illness, unspecified: Secondary | ICD-10-CM | POA: Diagnosis not present

## 2017-08-31 DIAGNOSIS — Z809 Family history of malignant neoplasm, unspecified: Secondary | ICD-10-CM | POA: Diagnosis not present

## 2017-08-31 DIAGNOSIS — Z7982 Long term (current) use of aspirin: Secondary | ICD-10-CM | POA: Diagnosis not present

## 2017-08-31 DIAGNOSIS — Z780 Asymptomatic menopausal state: Secondary | ICD-10-CM | POA: Diagnosis not present

## 2017-08-31 DIAGNOSIS — E785 Hyperlipidemia, unspecified: Secondary | ICD-10-CM | POA: Diagnosis not present

## 2017-08-31 DIAGNOSIS — K219 Gastro-esophageal reflux disease without esophagitis: Secondary | ICD-10-CM | POA: Diagnosis not present

## 2017-08-31 DIAGNOSIS — H409 Unspecified glaucoma: Secondary | ICD-10-CM | POA: Diagnosis not present

## 2017-08-31 DIAGNOSIS — I2510 Atherosclerotic heart disease of native coronary artery without angina pectoris: Secondary | ICD-10-CM | POA: Diagnosis not present

## 2017-12-09 DIAGNOSIS — Z1231 Encounter for screening mammogram for malignant neoplasm of breast: Secondary | ICD-10-CM | POA: Diagnosis not present

## 2017-12-09 DIAGNOSIS — Z01419 Encounter for gynecological examination (general) (routine) without abnormal findings: Secondary | ICD-10-CM | POA: Diagnosis not present

## 2017-12-28 DIAGNOSIS — Z23 Encounter for immunization: Secondary | ICD-10-CM | POA: Diagnosis not present

## 2018-01-22 ENCOUNTER — Other Ambulatory Visit: Payer: Self-pay | Admitting: Cardiovascular Disease

## 2018-01-26 ENCOUNTER — Other Ambulatory Visit: Payer: Self-pay | Admitting: Cardiovascular Disease

## 2018-01-26 NOTE — Telephone Encounter (Signed)
Courtesy refill  

## 2018-01-28 ENCOUNTER — Other Ambulatory Visit: Payer: Self-pay | Admitting: Cardiovascular Disease

## 2018-02-18 DIAGNOSIS — H04123 Dry eye syndrome of bilateral lacrimal glands: Secondary | ICD-10-CM | POA: Diagnosis not present

## 2018-02-18 DIAGNOSIS — H43813 Vitreous degeneration, bilateral: Secondary | ICD-10-CM | POA: Diagnosis not present

## 2018-02-18 DIAGNOSIS — H40013 Open angle with borderline findings, low risk, bilateral: Secondary | ICD-10-CM | POA: Diagnosis not present

## 2018-02-18 DIAGNOSIS — H25813 Combined forms of age-related cataract, bilateral: Secondary | ICD-10-CM | POA: Diagnosis not present

## 2018-02-18 DIAGNOSIS — H402222 Chronic angle-closure glaucoma, left eye, moderate stage: Secondary | ICD-10-CM | POA: Diagnosis not present

## 2018-03-04 ENCOUNTER — Other Ambulatory Visit: Payer: Self-pay | Admitting: Cardiovascular Disease

## 2018-03-18 DIAGNOSIS — H402222 Chronic angle-closure glaucoma, left eye, moderate stage: Secondary | ICD-10-CM | POA: Diagnosis not present

## 2018-03-18 DIAGNOSIS — H04123 Dry eye syndrome of bilateral lacrimal glands: Secondary | ICD-10-CM | POA: Diagnosis not present

## 2018-03-18 DIAGNOSIS — H40013 Open angle with borderline findings, low risk, bilateral: Secondary | ICD-10-CM | POA: Diagnosis not present

## 2018-03-18 DIAGNOSIS — H25813 Combined forms of age-related cataract, bilateral: Secondary | ICD-10-CM | POA: Diagnosis not present

## 2018-03-18 DIAGNOSIS — H43813 Vitreous degeneration, bilateral: Secondary | ICD-10-CM | POA: Diagnosis not present

## 2018-04-05 DIAGNOSIS — M79671 Pain in right foot: Secondary | ICD-10-CM | POA: Diagnosis not present

## 2018-04-05 DIAGNOSIS — M79674 Pain in right toe(s): Secondary | ICD-10-CM | POA: Diagnosis not present

## 2018-04-05 DIAGNOSIS — M7661 Achilles tendinitis, right leg: Secondary | ICD-10-CM | POA: Diagnosis not present

## 2018-04-05 DIAGNOSIS — M2041 Other hammer toe(s) (acquired), right foot: Secondary | ICD-10-CM | POA: Diagnosis not present

## 2018-04-26 ENCOUNTER — Other Ambulatory Visit: Payer: Self-pay | Admitting: Cardiovascular Disease

## 2018-05-12 ENCOUNTER — Other Ambulatory Visit: Payer: Self-pay | Admitting: Cardiovascular Disease

## 2018-05-12 NOTE — Telephone Encounter (Signed)
Rx(s) sent to pharmacy electronically.  

## 2018-06-02 ENCOUNTER — Other Ambulatory Visit: Payer: Self-pay | Admitting: Cardiovascular Disease

## 2018-06-14 DIAGNOSIS — S06343A Traumatic hemorrhage of right cerebrum with loss of consciousness of 1 hours to 5 hours 59 minutes, initial encounter: Secondary | ICD-10-CM | POA: Diagnosis not present

## 2018-06-14 DIAGNOSIS — J9602 Acute respiratory failure with hypercapnia: Secondary | ICD-10-CM | POA: Diagnosis not present

## 2018-06-14 DIAGNOSIS — Z951 Presence of aortocoronary bypass graft: Secondary | ICD-10-CM | POA: Diagnosis not present

## 2018-06-14 DIAGNOSIS — Z9911 Dependence on respirator [ventilator] status: Secondary | ICD-10-CM | POA: Diagnosis not present

## 2018-06-14 DIAGNOSIS — R918 Other nonspecific abnormal finding of lung field: Secondary | ICD-10-CM | POA: Diagnosis not present

## 2018-06-14 DIAGNOSIS — E785 Hyperlipidemia, unspecified: Secondary | ICD-10-CM | POA: Diagnosis not present

## 2018-06-14 DIAGNOSIS — I2510 Atherosclerotic heart disease of native coronary artery without angina pectoris: Secondary | ICD-10-CM | POA: Diagnosis not present

## 2018-06-14 DIAGNOSIS — S06349A Traumatic hemorrhage of right cerebrum with loss of consciousness of unspecified duration, initial encounter: Secondary | ICD-10-CM | POA: Diagnosis not present

## 2018-06-14 DIAGNOSIS — G9382 Brain death: Secondary | ICD-10-CM | POA: Diagnosis not present

## 2018-06-14 DIAGNOSIS — G935 Compression of brain: Secondary | ICD-10-CM | POA: Diagnosis not present

## 2018-06-14 DIAGNOSIS — J9600 Acute respiratory failure, unspecified whether with hypoxia or hypercapnia: Secondary | ICD-10-CM | POA: Diagnosis not present

## 2018-06-14 DIAGNOSIS — N179 Acute kidney failure, unspecified: Secondary | ICD-10-CM | POA: Diagnosis not present

## 2018-06-14 DIAGNOSIS — R9431 Abnormal electrocardiogram [ECG] [EKG]: Secondary | ICD-10-CM | POA: Diagnosis not present

## 2018-06-14 DIAGNOSIS — I619 Nontraumatic intracerebral hemorrhage, unspecified: Secondary | ICD-10-CM | POA: Diagnosis not present

## 2018-06-14 DIAGNOSIS — Z7401 Bed confinement status: Secondary | ICD-10-CM | POA: Diagnosis not present

## 2018-06-14 DIAGNOSIS — E7800 Pure hypercholesterolemia, unspecified: Secondary | ICD-10-CM | POA: Diagnosis not present

## 2018-06-14 DIAGNOSIS — I609 Nontraumatic subarachnoid hemorrhage, unspecified: Secondary | ICD-10-CM | POA: Diagnosis not present

## 2018-06-14 DIAGNOSIS — Z7982 Long term (current) use of aspirin: Secondary | ICD-10-CM | POA: Diagnosis not present

## 2018-06-14 DIAGNOSIS — R579 Shock, unspecified: Secondary | ICD-10-CM | POA: Diagnosis not present

## 2018-06-14 DIAGNOSIS — D689 Coagulation defect, unspecified: Secondary | ICD-10-CM | POA: Diagnosis not present

## 2018-06-14 DIAGNOSIS — I4891 Unspecified atrial fibrillation: Secondary | ICD-10-CM | POA: Diagnosis not present

## 2018-06-14 DIAGNOSIS — I10 Essential (primary) hypertension: Secondary | ICD-10-CM | POA: Diagnosis not present

## 2018-06-14 DIAGNOSIS — Z8679 Personal history of other diseases of the circulatory system: Secondary | ICD-10-CM | POA: Diagnosis not present

## 2018-06-14 DIAGNOSIS — I629 Nontraumatic intracranial hemorrhage, unspecified: Secondary | ICD-10-CM | POA: Diagnosis not present

## 2018-06-14 DIAGNOSIS — I611 Nontraumatic intracerebral hemorrhage in hemisphere, cortical: Secondary | ICD-10-CM | POA: Diagnosis not present

## 2018-06-14 DIAGNOSIS — Z66 Do not resuscitate: Secondary | ICD-10-CM | POA: Diagnosis not present

## 2018-06-14 DIAGNOSIS — G936 Cerebral edema: Secondary | ICD-10-CM | POA: Diagnosis not present

## 2018-06-14 DIAGNOSIS — I6200 Nontraumatic subdural hemorrhage, unspecified: Secondary | ICD-10-CM | POA: Diagnosis not present

## 2018-06-14 DIAGNOSIS — I618 Other nontraumatic intracerebral hemorrhage: Secondary | ICD-10-CM | POA: Diagnosis not present

## 2018-06-14 DIAGNOSIS — J9601 Acute respiratory failure with hypoxia: Secondary | ICD-10-CM | POA: Diagnosis not present

## 2018-06-14 DIAGNOSIS — I639 Cerebral infarction, unspecified: Secondary | ICD-10-CM | POA: Diagnosis not present

## 2018-06-14 DIAGNOSIS — Z515 Encounter for palliative care: Secondary | ICD-10-CM | POA: Diagnosis not present

## 2018-07-09 DEATH — deceased

## 2019-12-24 IMAGING — US US EXTREM LOW VENOUS*R*
1 series · 14 of 24 positions shown · non-contrast
Comparison: None

CLINICAL DATA: Pain x1 week

EXAM:
RIGHT LOWER EXTREMITY VENOUS DOPPLER ULTRASOUND
TECHNIQUE: Gray-scale sonography with compression, as well as color and duplex
ultrasound, were performed to evaluate the deep venous system from
the level of the common femoral vein through the popliteal and
proximal calf veins.

[Series 1: us extrem low venous*right* · 0.08mm/px · 14 of 39 slices shown]
[im 1/39]
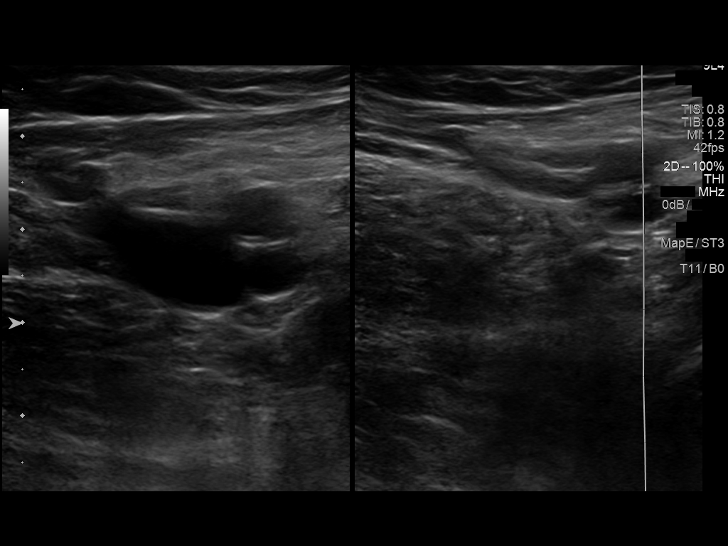
[im 4/39]
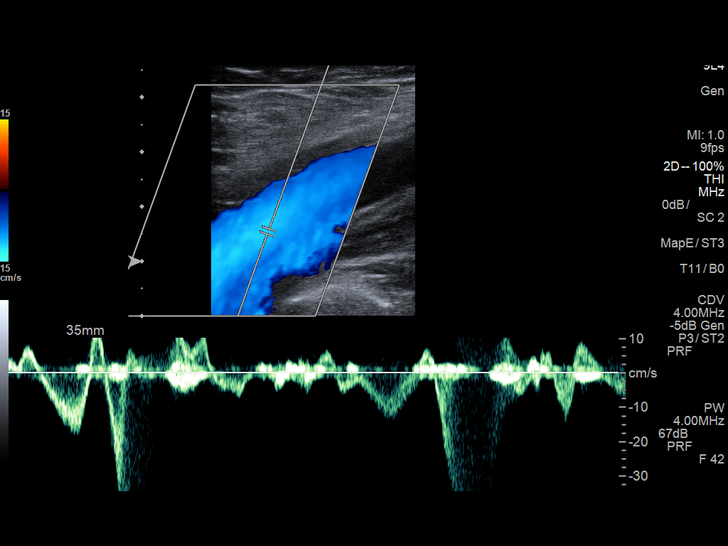
[im 7/39]
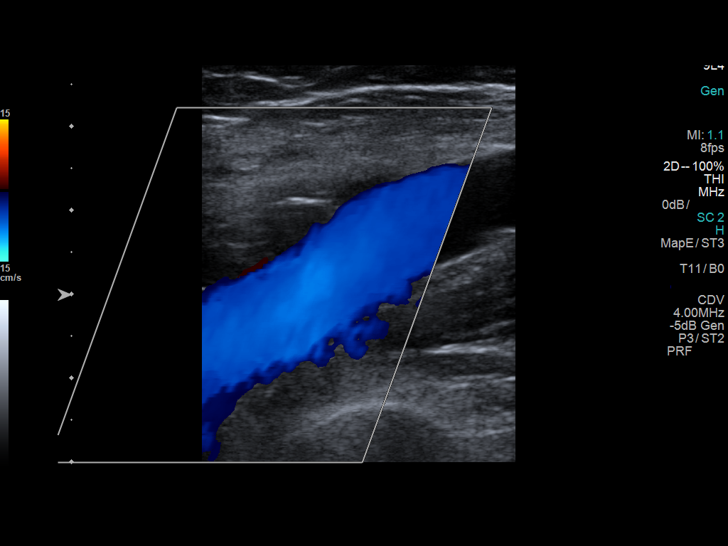
[im 10/39]
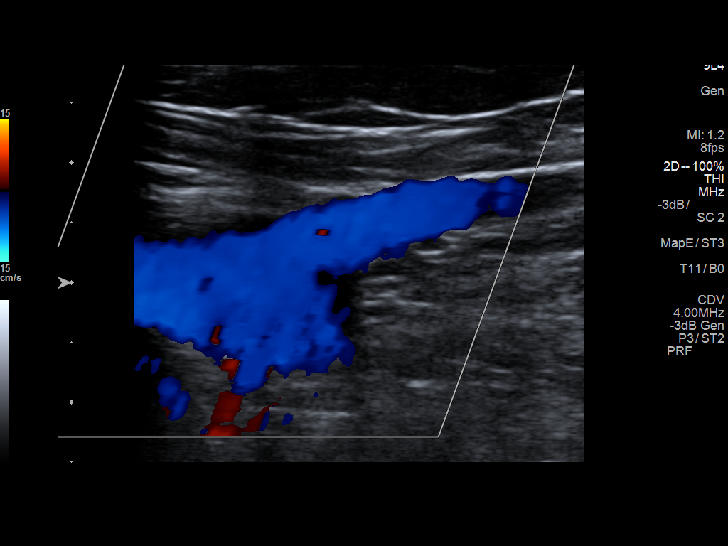
[im 12/39]
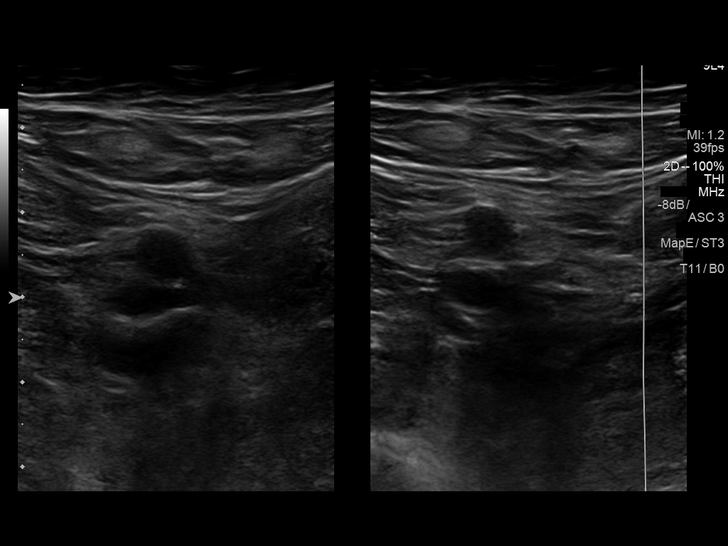
[im 15/39]
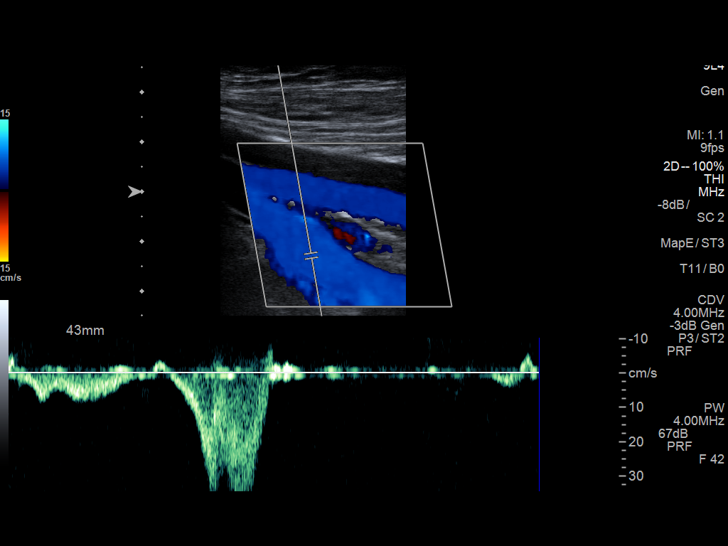
[im 19/39]
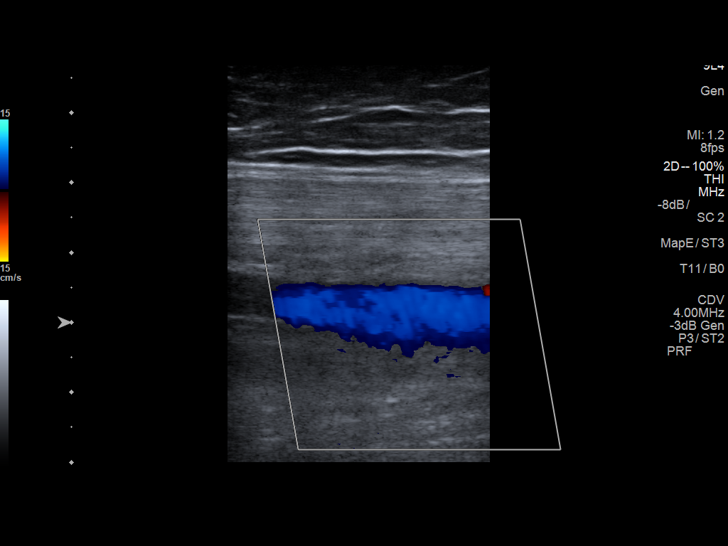
[im 20/39]
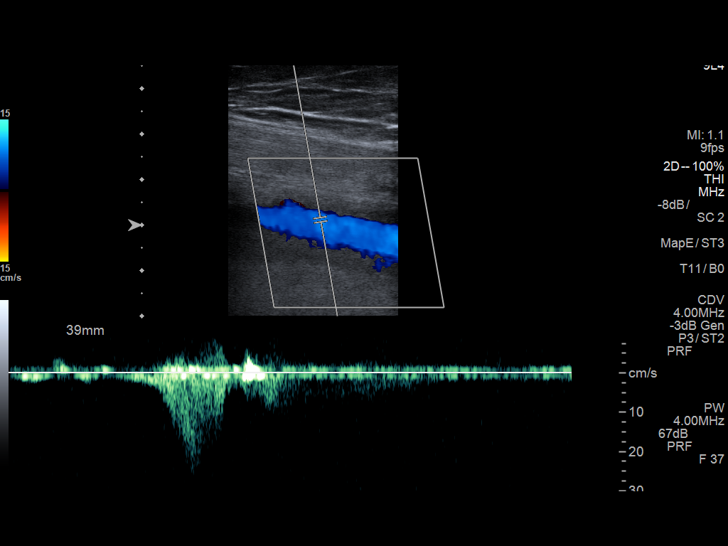
[im 24/39]
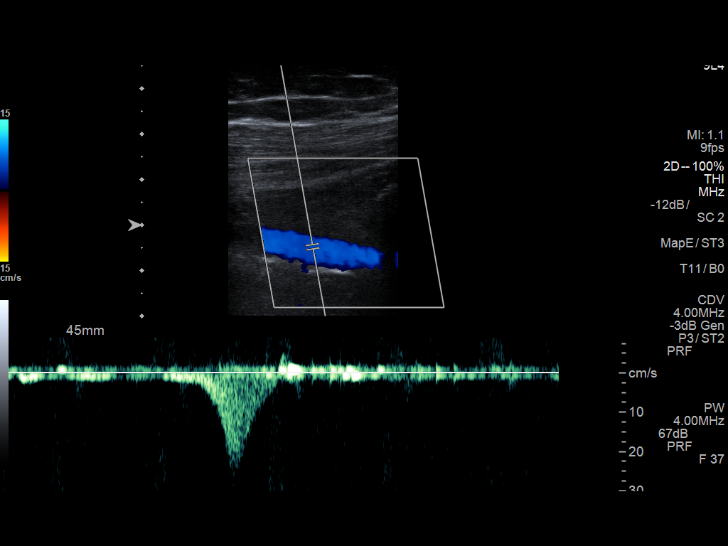
[im 27/39]
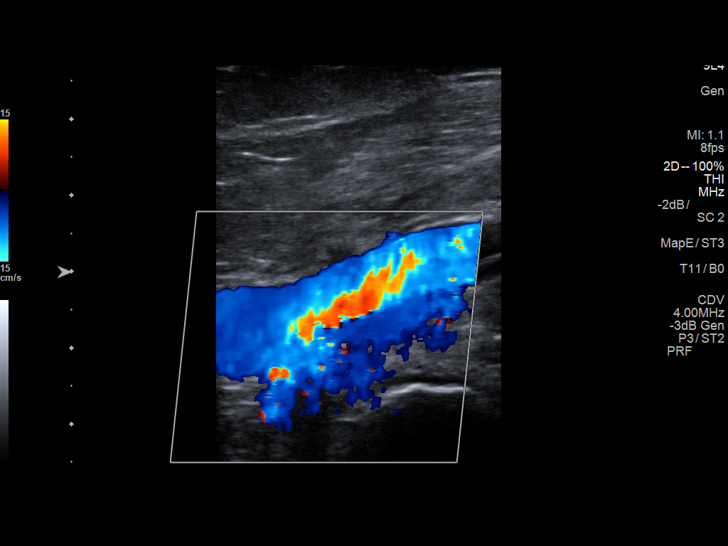
[im 30/39]
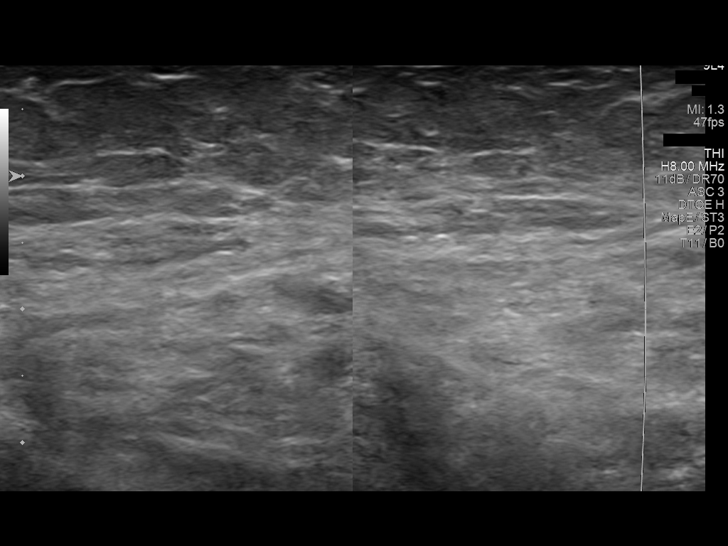
[im 32/39]
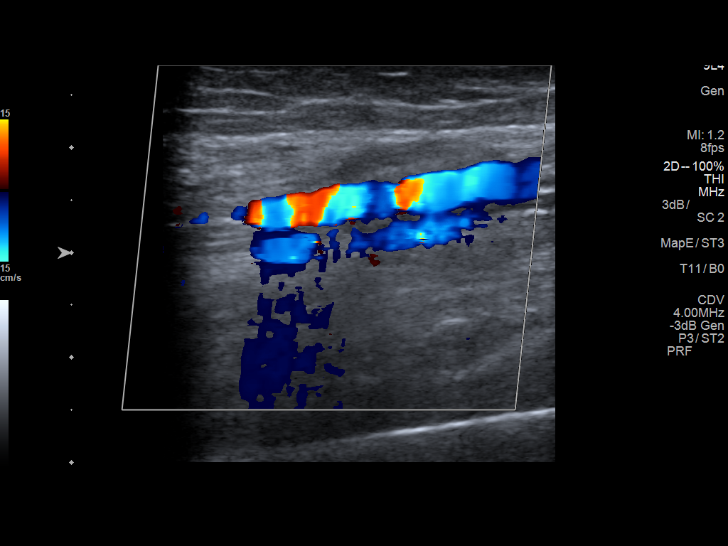
[im 35/39]
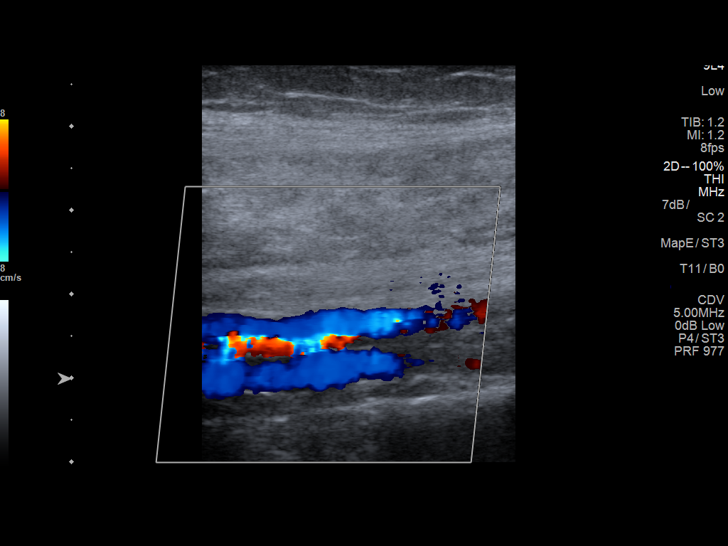
[im 39/39]
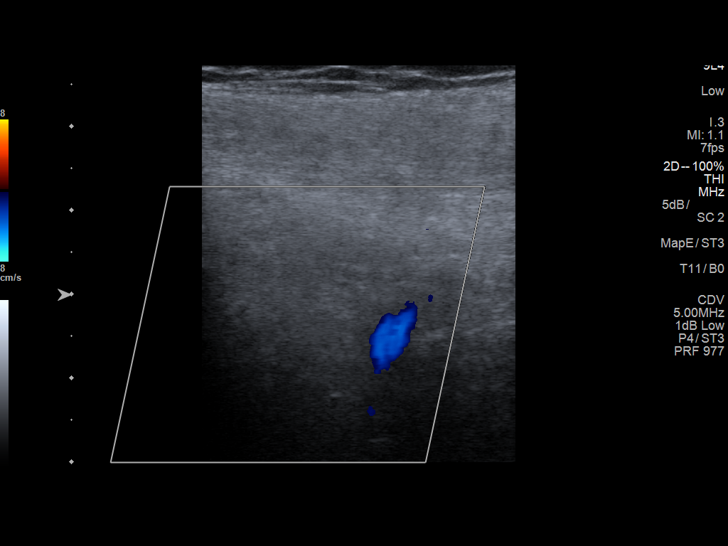

[14 of 24 positions shown; findings below may reference images not displayed]

FINDINGS: Normal compressibility of the common femoral, superficial femoral,
and popliteal veins, as well as the proximal calf veins. No filling
defects to suggest DVT on grayscale or color Doppler imaging.
Doppler waveforms show normal direction of venous flow, normal
respiratory phasicity and response to augmentation. Survey views of
the contralateral common femoral vein are unremarkable.
IMPRESSION: No evidence of RIGHT lower extremity deep vein thrombosis.

## 2020-05-03 ENCOUNTER — Encounter: Payer: Self-pay | Admitting: Internal Medicine
